# Patient Record
Sex: Female | Born: 1985 | Race: Black or African American | Hispanic: No | Marital: Single | Smoking: Never smoker
Health system: Southern US, Community
[De-identification: ages and names within clinical notes are randomized; demographics above are authoritative.]

## PROBLEM LIST (undated history)

## (undated) ENCOUNTER — Inpatient Hospital Stay (HOSPITAL_COMMUNITY): Payer: Self-pay

## (undated) DIAGNOSIS — L732 Hidradenitis suppurativa: Secondary | ICD-10-CM

## (undated) DIAGNOSIS — Z789 Other specified health status: Secondary | ICD-10-CM

## (undated) HISTORY — PX: DILATION AND CURETTAGE OF UTERUS: SHX78

## (undated) HISTORY — PX: THERAPEUTIC ABORTION: SHX798

---

## 2016-12-10 ENCOUNTER — Encounter: Payer: Self-pay | Admitting: Family Medicine

## 2016-12-18 ENCOUNTER — Ambulatory Visit (INDEPENDENT_AMBULATORY_CARE_PROVIDER_SITE_OTHER): Payer: No Typology Code available for payment source | Admitting: Physician Assistant

## 2016-12-18 ENCOUNTER — Encounter: Payer: Self-pay | Admitting: Physician Assistant

## 2016-12-18 VITALS — BP 115/79 | HR 90 | Temp 98.2°F | Resp 18 | Ht 60.55 in | Wt 156.4 lb

## 2016-12-18 DIAGNOSIS — N760 Acute vaginitis: Secondary | ICD-10-CM

## 2016-12-18 DIAGNOSIS — Z3201 Encounter for pregnancy test, result positive: Secondary | ICD-10-CM | POA: Diagnosis not present

## 2016-12-18 DIAGNOSIS — Z13220 Encounter for screening for lipoid disorders: Secondary | ICD-10-CM | POA: Diagnosis not present

## 2016-12-18 DIAGNOSIS — N898 Other specified noninflammatory disorders of vagina: Secondary | ICD-10-CM | POA: Diagnosis not present

## 2016-12-18 DIAGNOSIS — Z0000 Encounter for general adult medical examination without abnormal findings: Secondary | ICD-10-CM | POA: Diagnosis not present

## 2016-12-18 DIAGNOSIS — Z13228 Encounter for screening for other metabolic disorders: Secondary | ICD-10-CM

## 2016-12-18 DIAGNOSIS — Z130 Encounter for screening for diseases of the blood and blood-forming organs and certain disorders involving the immune mechanism: Secondary | ICD-10-CM

## 2016-12-18 DIAGNOSIS — Z1389 Encounter for screening for other disorder: Secondary | ICD-10-CM | POA: Diagnosis not present

## 2016-12-18 DIAGNOSIS — N926 Irregular menstruation, unspecified: Secondary | ICD-10-CM | POA: Diagnosis not present

## 2016-12-18 DIAGNOSIS — B9689 Other specified bacterial agents as the cause of diseases classified elsewhere: Secondary | ICD-10-CM | POA: Diagnosis not present

## 2016-12-18 DIAGNOSIS — Z1329 Encounter for screening for other suspected endocrine disorder: Secondary | ICD-10-CM | POA: Diagnosis not present

## 2016-12-18 LAB — POCT URINE PREGNANCY: PREG TEST UR: POSITIVE — AB

## 2016-12-18 LAB — POCT URINALYSIS DIP (MANUAL ENTRY)
BILIRUBIN UA: NEGATIVE mg/dL
Bilirubin, UA: NEGATIVE
Glucose, UA: NEGATIVE mg/dL
Nitrite, UA: NEGATIVE
RBC UA: NEGATIVE
SPEC GRAV UA: 1.025 (ref 1.010–1.025)
UROBILINOGEN UA: 1 U/dL
pH, UA: 7 (ref 5.0–8.0)

## 2016-12-18 LAB — POCT WET + KOH PREP
Trich by wet prep: ABSENT
YEAST BY KOH: ABSENT
Yeast by wet prep: ABSENT

## 2016-12-18 MED ORDER — CLINDAMYCIN PHOSPHATE 2 % VA CREA: 1.0000 | TOPICAL_CREAM | Freq: Every day | VAGINAL | 0 refills | Status: AC

## 2016-12-18 NOTE — Progress Notes (Signed)
Crystal Dominguez  MRN: [id number] DOB: June 28, 1985  Subjective:  Pt is a 31 y.o. female who presents for annual physical exam. Pt is fasting today.   Social: She is from [place]. Moved here about 7 months ago. 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 She is currently in a relationship. She is sexually active with monogamous partner. Diet: Eats everything. Eats [place] mostly. Does not get enough fruits and veggies. Minimal dairy. Drinks water mostly. Does not take a multivitamin.  Exercise: No structured exercise.  Sleep: Gets at least 8 hours.  Bowel Movements: Daily, regular for her.  Menstrual Cycle: Typically regular, cycles occur every 25 days and last 5 days. LMP 11/07/16, did take a morning after pill on 11/19/2016. She is not on any birth control.   Last annual exam: 2017 Last dental exam: 05.2017 Last vision exam: 2016 Last pap smear: 11/2015, just saw a gynecologist in 09/2016 for an establish care visit.   Vaccinations      Tetanus: She knows it was less than 10 years ago.        Acute issues:  Vaginal odor: Has been having a vaginal odor x 1 week. Last treated for BV in 09/2015. Has one sexual partner. Denies vaginal discharge and itching.   There are no active problems to display for this patient.   No current outpatient prescriptions on file prior to visit.   No current facility-administered medications on file prior to visit.     Allergies  Allergen Reactions  . Shellfish-Derived Products     Crabmeat    Social History   Social History  . Marital status: Single    Spouse name: N/A  . Number of children: N/A  . Years of education: N/A   Social History Main Topics  . Smoking status: Never Smoker  . Smokeless tobacco: Never Used  . Alcohol use 1.8 oz/week    3 Glasses of wine per week  . Drug use: No  . Sexual activity: Yes    Partners: Male    Birth control/ protection: None   Other Topics Concern  . None   Social  History Narrative   She is from [place]. 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 She is sexually active with monogamous partner.     History reviewed. No pertinent surgical history.  History reviewed. No pertinent family history.  Review of Systems  Constitutional: Negative for activity change, appetite change, chills, diaphoresis, fatigue, fever and unexpected weight change.  HENT: Negative for congestion, dental problem, drooling, ear discharge, ear pain, facial swelling, hearing loss, mouth sores, nosebleeds, postnasal drip, rhinorrhea, sinus pain, sinus pressure, sneezing, sore throat, tinnitus, trouble swallowing and voice change.   Eyes: Negative for photophobia, pain, discharge, redness, itching and visual disturbance.  Respiratory: Negative for apnea, cough, choking, chest tightness, shortness of breath, wheezing and stridor.   Cardiovascular: Negative for chest pain, palpitations and leg swelling.  Gastrointestinal: Negative for abdominal distention, abdominal pain, anal bleeding, blood in stool, constipation, diarrhea, nausea, rectal pain and vomiting.  Endocrine: Negative for cold intolerance, heat intolerance, polydipsia, polyphagia and polyuria.  Genitourinary: Negative for decreased urine volume, difficulty urinating, dyspareunia, dysuria, enuresis, flank pain, frequency, genital sores, hematuria, menstrual problem, pelvic pain, urgency, vaginal bleeding, vaginal discharge and vaginal pain.  Musculoskeletal: Negative for arthralgias, back pain, gait problem, joint swelling, myalgias, neck pain and neck stiffness.  Skin: Negative for color change, pallor, rash  and wound.  Allergic/Immunologic: Negative for environmental allergies, food allergies and immunocompromised state.  Neurological: Negative for dizziness, tremors, seizures, syncope, facial asymmetry, speech difficulty, weakness, light-headedness, numbness and headaches.    Hematological: Negative for adenopathy. Does not bruise/bleed easily.  Psychiatric/Behavioral: Negative for agitation, behavioral problems, confusion, decreased concentration, dysphoric mood, hallucinations, self-injury, sleep disturbance and suicidal ideas. The patient is not nervous/anxious and is not hyperactive.     Objective:  BP 115/79 (BP Location: Right Arm, Patient Position: Sitting, Cuff Size: Normal)   Pulse 90   Temp 98.2 F (36.8 C) (Oral)   Resp 18   Ht 5' 0.55" (1.538 m)   Wt 156 lb 6.4 oz (70.9 kg)   LMP 11/07/2016 (Exact Date)   SpO2 100%   BMI 29.99 kg/m   Physical Exam  Constitutional: She is oriented to person, place, and time and well-developed, well-nourished, and in no distress.  HENT:  Head: Normocephalic and atraumatic.  Right Ear: Hearing, tympanic membrane, external ear and ear canal normal.  Left Ear: Hearing, tympanic membrane, external ear and ear canal normal.  Nose: Nose normal.  Mouth/Throat: Uvula is midline, oropharynx is clear and moist and mucous membranes are normal. No oropharyngeal exudate.  Eyes: Pupils are equal, round, and reactive to light. Conjunctivae, EOM and lids are normal. No scleral icterus.  Neck: Trachea normal and normal range of motion. No thyroid mass and no thyromegaly present.  Cardiovascular: Normal rate, regular rhythm, normal heart sounds and intact distal pulses.   Pulmonary/Chest: Effort normal and breath sounds normal.  Abdominal: Soft. Normal appearance and bowel sounds are normal. There is no tenderness.  Navel piercing noted.   Genitourinary: Vulva normal. Thin  fishy  white and vaginal discharge found.  Lymphadenopathy:       Head (right side): No tonsillar, no preauricular, no posterior auricular and no occipital adenopathy present.       Head (left side): No tonsillar, no preauricular, no posterior auricular and no occipital adenopathy present.    She has no cervical adenopathy.       Right: No supraclavicular  adenopathy present.       Left: No supraclavicular adenopathy present.  Neurological: She is alert and oriented to person, place, and time. She has normal sensation, normal strength and normal reflexes. Gait normal.  Skin: Skin is warm and dry.  Psychiatric: Affect normal.   No exam data present   Results for orders placed or performed in visit on 12/18/16 (from the past 24 hour(s))  POCT urinalysis dipstick     Status: Abnormal   Collection Time: 12/18/16  9:09 AM  Result Value Ref Range   Color, UA yellow yellow   Clarity, UA clear clear   Glucose, UA negative negative mg/dL   Bilirubin, UA negative negative   Ketones, POC UA negative negative mg/dL   Spec Grav, UA 1.025 1.010 - 1.025   Blood, UA negative negative   pH, UA 7.0 5.0 - 8.0   Protein Ur, POC trace (A) negative mg/dL   Urobilinogen, UA 1.0 0.2 or 1.0 E.U./dL   Nitrite, UA Negative Negative   Leukocytes, UA Trace (A) Negative  POCT urine pregnancy     Status: Abnormal   Collection Time: 12/18/16  9:09 AM  Result Value Ref Range   Preg Test, Ur Positive (A) Negative  POCT Wet + KOH Prep     Status: Abnormal   Collection Time: 12/18/16  9:13 AM  Result Value Ref Range   Yeast by KOH  Absent Absent   Yeast by wet prep Absent Absent   WBC by wet prep None (A) Few   Clue Cells Wet Prep HPF POC Moderate (A) None   Trich by wet prep Absent Absent   Bacteria Wet Prep HPF POC Too numerous to count  (A) Few   Epithelial Cells By [employer] ([facility]) None None, Few, Too numerous to count   RBC,UR,HPF,POC None None RBC/hpf    Assessment and Plan :  Discussed healthy lifestyle, diet, exercise, preventative care, vaccinations, and addressed patient's concerns. Plan for follow up in one year. Otherwise, plan for specific conditions below.  1. Annual physical exam Await lab results   2. Screening, anemia, deficiency, iron - CBC with Differential/Platelet  3. Screening for metabolic disorder - QMG86+PYPP  4. Screening,  lipid - Lipid panel  5. Screening for thyroid disorder - TSH  6. Screening for hematuria or proteinuria - POCT urinalysis dipstick  7. Vaginal odor - POCT Wet + KOH Prep  8. Irregular menstrual cycle - POCT urine pregnancy  9. Positive pregnancy test Positive pregnancy test. Pt encouraged to contact gyn and schedule a follow up appointment. Avoid alcohol at this time.  - Beta HCG, Quant  10. Bacterial vaginosis Will tx with cleocin vaginal cream due to positive pregnancy test. Given educational material on BV.  - clindamycin (CLEOCIN) 2 % vaginal cream; Place 1 Applicatorful vaginally at bedtime.  Dispense: 40 g; Refill: 0   Tenna Delaine, PA-C  [facility] 12/18/2016 12:38 PM

## 2016-12-18 NOTE — Patient Instructions (Addendum)
Start taking a daily multivitamin. Increase your vegetable and fruit consumption. Start engaging in structured exercise at least twice a week.       Commonly Asked Questions During Pregnancy  Cats: A parasite can be excreted in cat feces.  To avoid exposure you need to have another person empty the little box.  If you must empty the litter box you will need to wear gloves.  Wash your hands after handling your cat.  This parasite can also be found in raw or undercooked meat so this should also be avoided.  Colds, Sore Throats, Flu: Please check your medication sheet to see what you can take for symptoms.  If your symptoms are unrelieved by these medications please call the office.  Dental Work: Most any dental work [employer] recommends is permitted.  X-rays should only be taken during the first trimester if absolutely necessary.  Your abdomen should be shielded with a lead apron during all x-rays.  Please notify your provider prior to receiving any x-rays.  Novocaine is fine; gas is not recommended.  If your dentist requires a note from [place] prior to dental work please call the office and we will provide one for you.  Exercise: Exercise is an important part of staying healthy during your pregnancy.  You may continue most exercises you were accustomed to prior to pregnancy.  Later in your pregnancy you will most likely notice you have difficulty with activities requiring balance like riding a bicycle.  It is important that you listen to your body and avoid activities that put you at a higher risk of falling.  Adequate rest and staying well hydrated are a must!  If you have questions about the safety of specific activities ask your provider.    Exposure to Children with illness: Try to avoid obvious exposure; report any symptoms to [place] when noted,  If you have chicken pos, red measles or mumps, you should be immune to these diseases.   Please do not take any vaccines while pregnant unless you have  checked with your OB provider.  Fetal Movement: After 28 weeks we recommend you do "kick counts" twice daily.  Lie or sit down in a calm quiet environment and count your baby movements "kicks".  You should feel your baby at least 10 times per hour.  If you have not felt 10 kicks within the first hour get up, walk around and have something sweet to eat or drink then repeat for an additional hour.  If count remains less than 10 per hour notify your provider.  Fumigating: Follow your pest control agent's advice as to how long to stay out of your home.  Ventilate the area well before re-entering.  Hemorrhoids:   Most over-the-counter preparations can be used during pregnancy.  Check your medication to see what is safe to use.  It is important to use a stool softener or fiber in your diet and to drink lots of liquids.  If hemorrhoids seem to be getting worse please call the office.   Hot Tubs:  Hot tubs Jacuzzis and saunas are not recommended while pregnant.  These increase your internal body temperature and should be avoided.  Intercourse:  Sexual intercourse is safe during pregnancy as long as you are comfortable, unless otherwise advised by your provider.  Spotting may occur after intercourse; report any bright red bleeding that is heavier than spotting.  Labor:  If you know that you are in labor, please go to the hospital.  If you are unsure, please call the office and let us help you decide what to do.  Lifting, straining, etc:  If your job requires heavy lifting or straining please check with your provider for any limitations.  Generally, you should not lift items heavier than that you can lift simply with your hands and arms (no back muscles)  Painting:  Paint fumes do not harm your pregnancy, but may make you ill and should be avoided if possible.  Latex or water based paints have less odor than oils.  Use adequate ventilation while painting.  Permanents & Hair Color:  Chemicals in hair dyes are  not recommended as they cause increase hair dryness which can increase hair loss during pregnancy.  " Highlighting" and permanents are allowed.  Dye may be absorbed differently and permanents may not hold as well during pregnancy.  Sunbathing:  Use a sunscreen, as skin burns easily during pregnancy.  Drink plenty of fluids; avoid over heating.  Tanning Beds:  Because their possible side effects are still unknown, tanning beds are not recommended.  Ultrasound Scans:  Routine ultrasounds are performed at approximately 20 weeks.  You will be able to see your baby's general anatomy an if you would like to know the gender this can usually be determined as well.  If it is questionable when you conceived you may also receive an ultrasound early in your pregnancy for dating purposes.  Otherwise ultrasound exams are not routinely performed unless there is a medical necessity.  Although you can request a scan we ask that you pay for it when conducted because insurance does not cover " patient request" scans.  Work: If your pregnancy proceeds without complications you may work until your due date, unless your physician or employer advises otherwise.  Round Ligament Pain/Pelvic Discomfort:  Sharp, shooting pains not associated with bleeding are fairly common, usually occurring in the second trimester of pregnancy.  They tend to be worse when standing up or when you remain standing for long periods of time.  These are the result of pressure of certain pelvic ligaments called "round ligaments".  Rest, Tylenol and heat seem to be the most effective relief.  As the womb and fetus grow, they rise out of the pelvis and the discomfort improves.  Please notify the office if your pain seems different than that described.  It may represent a more serious condition.   Bacterial Vaginosis Bacterial vaginosis is an infection of the vagina. It happens when too many germs (bacteria) grow in the vagina. This infection puts you at  risk for infections from sex (STIs). Treating this infection can lower your risk for some STIs. You should also treat this if you are pregnant. It can cause your baby to be born early. Follow these instructions at home: Medicines  Take over-the-counter and prescription medicines only as told by your doctor.  Take or use your antibiotic medicine as told by your doctor. Do not stop taking or using it even if you start to feel better. General instructions  If you your sexual partner is a woman, tell her that you have this infection. She needs to get treatment if she has symptoms. If you have a female partner, he does not need to be treated.  During treatment: ? Avoid sex. ? Do not douche. ? Avoid alcohol as told. ? Avoid breastfeeding as told.  Drink enough fluid to keep your pee (urine) clear or pale yellow.  Keep your vagina and butt (rectum) clean. ?  Wash the area with warm water every day. ? Wipe from front to back after you use the toilet.  Keep all follow-up visits as told by your doctor. This is important. Preventing this condition  Do not douche.  Use only warm water to wash around your vagina.  Use protection when you have sex. This includes: ? Latex condoms. ? Dental dams.  Limit how many people you have sex with. It is best to only have sex with the same person (be monogamous).  Get tested for STIs. Have your partner get tested.  Wear underwear that is cotton or lined with cotton.  Avoid tight pants and pantyhose. This is most important in summer.  Do not use any products that have nicotine or tobacco in them. These include cigarettes and e-cigarettes. If you need help quitting, ask your doctor.  Do not use illegal drugs.  Limit how much alcohol you drink. Contact a doctor if:  Your symptoms do not get better, even after you are treated.  You have more discharge or pain when you pee (urinate).  You have a fever.  You have pain in your belly  (abdomen).  You have pain with sex.  Your bleed from your vagina between periods. Summary  This infection happens when too many germs (bacteria) grow in the vagina.  Treating this condition can lower your risk for some infections from sex (STIs).  You should also treat this if you are pregnant. It can cause early (premature) birth.  Do not stop taking or using your antibiotic medicine even if you start to feel better. This information is not intended to replace advice given to you by your health care provider. Make sure you discuss any questions you have with your health care provider. Document Released: 02/20/2008 Document Revised: 01/27/2016 Document Reviewed: 01/27/2016 Elsevier Interactive Patient Education  2017 [place] Maintenance, Female Adopting a healthy lifestyle and getting preventive care can go a long way to promote health and wellness. Talk with your health care provider about what schedule of regular examinations is right for you. This is a good chance for you to check in with your provider about disease prevention and staying healthy. In between checkups, there are plenty of things you can do on your own. Experts have done a lot of research about which lifestyle changes and preventive measures are most likely to keep you healthy. Ask your health care provider for more information. Weight and diet Eat a healthy diet  Be sure to include plenty of vegetables, fruits, low-fat dairy products, and lean protein.  Do not eat a lot of foods high in solid fats, added sugars, or salt.  Get regular exercise. This is one of the most important things you can do for your health. ? Most adults should exercise for at least 150 minutes each week. The exercise should increase your heart rate and make you sweat (moderate-intensity exercise). ? Most adults should also do strengthening exercises at least twice a week. This is in addition to the moderate-intensity  exercise.  Maintain a healthy weight  Body mass index (BMI) is a measurement that can be used to identify possible weight problems. It estimates body fat based on height and weight. Your health care provider can help determine your BMI and help you achieve or maintain a healthy weight.  For females 29 years of age and older: ? A BMI below 18.5 is considered underweight. ? A BMI of 18.5 to 24.9 is normal. ? A BMI  of 25 to 29.9 is considered overweight. ? A BMI of 30 and above is considered obese.  Watch levels of cholesterol and blood lipids  You should start having your blood tested for lipids and cholesterol at 29 years of age, then have this test every 5 years.  You may need to have your cholesterol levels checked more often if: ? Your lipid or cholesterol levels are high. ? You are older than 29 years of age. ? You are at high risk for heart disease.  Cancer screening Lung Cancer  Lung cancer screening is recommended for adults 26-20 years old who are at high risk for lung cancer because of a history of smoking.  A yearly low-dose CT scan of the lungs is recommended for people who: ? Currently smoke. ? Have quit within the past 15 years. ? Have at least a 30-pack-year history of smoking. A pack year is smoking an average of one pack of cigarettes a day for 1 year.  Yearly screening should continue until it has been 15 years since you quit.  Yearly screening should stop if you develop a health problem that would prevent you from having lung cancer treatment.  Breast Cancer  Practice breast self-awareness. This means understanding how your breasts normally appear and feel.  It also means doing regular breast self-exams. Let your health care provider know about any changes, no matter how small.  If you are in your 20s or 30s, you should have a clinical breast exam (CBE) by a health care provider every 1-3 years as part of a regular health exam.  If you are 33 or older, have  a CBE every year. Also consider having a breast X-ray (mammogram) every year.  If you have a family history of breast cancer, talk to your health care provider about genetic screening.  If you are at high risk for breast cancer, talk to your health care provider about having an MRI and a mammogram every year.  Breast cancer gene (BRCA) assessment is recommended for women who have family members with BRCA-related cancers. BRCA-related cancers include: ? Breast. ? Ovarian. ? Tubal. ? Peritoneal cancers.  Results of the assessment will determine the need for genetic counseling and BRCA1 and BRCA2 testing.  Cervical Cancer Your health care provider may recommend that you be screened regularly for cancer of the pelvic organs (ovaries, uterus, and vagina). This screening involves a pelvic examination, including checking for microscopic changes to the surface of your cervix (Pap test). You may be encouraged to have this screening done every 3 years, beginning at age 41.  For women ages 45-65, health care providers may recommend pelvic exams and Pap testing every 3 years, or they may recommend the Pap and pelvic exam, combined with testing for human papilloma virus (HPV), every 5 years. Some types of HPV increase your risk of cervical cancer. Testing for HPV may also be done on women of any age with unclear Pap test results.  Other health care providers may not recommend any screening for nonpregnant women who are considered low risk for pelvic cancer and who do not have symptoms. Ask your health care provider if a screening pelvic exam is right for you.  If you have had past treatment for cervical cancer or a condition that could lead to cancer, you need Pap tests and screening for cancer for at least 20 years after your treatment. If Pap tests have been discontinued, your risk factors (such as having a new sexual partner)  need to be reassessed to determine if screening should resume. Some women have  medical problems that increase the chance of getting cervical cancer. In these cases, your health care provider may recommend more frequent screening and Pap tests.  Colorectal Cancer  This type of cancer can be detected and often prevented.  Routine colorectal cancer screening usually begins at 29 years of age and continues through 29 years of age.  Your health care provider may recommend screening at an earlier age if you have risk factors for colon cancer.  Your health care provider may also recommend using home test kits to check for hidden blood in the stool.  A small camera at the end of a tube can be used to examine your colon directly (sigmoidoscopy or colonoscopy). This is done to check for the earliest forms of colorectal cancer.  Routine screening usually begins at age 59.  Direct examination of the colon should be repeated every 5-10 years through 29 years of age. However, you may need to be screened more often if early forms of precancerous polyps or small growths are found.  Skin Cancer  Check your skin from head to toe regularly.  Tell your health care provider about any new moles or changes in moles, especially if there is a change in a mole's shape or color.  Also tell your health care provider if you have a mole that is larger than the size of a pencil eraser.  Always use sunscreen. Apply sunscreen liberally and repeatedly throughout the day.  Protect yourself by wearing long sleeves, pants, a wide-brimmed hat, and sunglasses whenever you are outside.  Heart disease, diabetes, and high blood pressure  High blood pressure causes heart disease and increases the risk of stroke. High blood pressure is more likely to develop in: ? People who have blood pressure in the high end of the normal range (130-139/85-89 mm Hg). ? People who are overweight or obese. ? People who are African American.  If you are 58-54 years of age, have your blood pressure checked every 3-5  years. If you are 80 years of age or older, have your blood pressure checked every year. You should have your blood pressure measured twice-once when you are at a hospital or clinic, and once when you are not at a hospital or clinic. Record the average of the two measurements. To check your blood pressure when you are not at a hospital or clinic, you can use: ? An automated blood pressure machine at a pharmacy. ? A home blood pressure monitor.  If you are between 74 years and 41 years old, ask your health care provider if you should take aspirin to prevent strokes.  Have regular diabetes screenings. This involves taking a blood sample to check your fasting blood sugar level. ? If you are at a normal weight and have a low risk for diabetes, have this test once every three years after 29 years of age. ? If you are overweight and have a high risk for diabetes, consider being tested at a younger age or more often. Preventing infection Hepatitis B  If you have a higher risk for hepatitis B, you should be screened for this virus. You are considered at high risk for hepatitis B if: ? You were born in a country where hepatitis B is common. Ask your health care provider which countries are considered high risk. ? Your parents were born in a high-risk country, and you have not been immunized against hepatitis  B (hepatitis B vaccine). ? You have HIV or AIDS. ? You use needles to inject street drugs. ? You live with someone who has hepatitis B. ? You have had sex with someone who has hepatitis B. ? You get hemodialysis treatment. ? You take certain medicines for conditions, including cancer, organ transplantation, and autoimmune conditions.  Hepatitis C  Blood testing is recommended for: ? Everyone born from [birth year] through 1963. ? Anyone with known risk factors for hepatitis C.  Sexually transmitted infections (STIs)  You should be screened for sexually transmitted infections (STIs) including  gonorrhea and chlamydia if: ? You are sexually active and are younger than 29 years of age. ? You are older than 29 years of age and your health care provider tells you that you are at risk for this type of infection. ? Your sexual activity has changed since you were last screened and you are at an increased risk for chlamydia or gonorrhea. Ask your health care provider if you are at risk.  If you do not have HIV, but are at risk, it may be recommended that you take a prescription medicine daily to prevent HIV infection. This is called pre-exposure prophylaxis (PrEP). You are considered at risk if: ? You are sexually active and do not regularly use condoms or know the HIV status of your partner(s). ? You take drugs by injection. ? You are sexually active with a partner who has HIV.  Talk with your health care provider about whether you are at high risk of being infected with HIV. If you choose to begin PrEP, you should first be tested for HIV. You should then be tested every 3 months for as long as you are taking PrEP. Pregnancy  If you are premenopausal and you may become pregnant, ask your health care provider about preconception counseling.  If you may become pregnant, take 400 to 800 micrograms (mcg) of folic acid every day.  If you want to prevent pregnancy, talk to your health care provider about birth control (contraception). Osteoporosis and menopause  Osteoporosis is a disease in which the bones lose minerals and strength with aging. This can result in serious bone fractures. Your risk for osteoporosis can be identified using a bone density scan.  If you are 4 years of age or older, or if you are at risk for osteoporosis and fractures, ask your health care provider if you should be screened.  Ask your health care provider whether you should take a calcium or vitamin D supplement to lower your risk for osteoporosis.  Menopause may have certain physical symptoms and  risks.  Hormone replacement therapy may reduce some of these symptoms and risks. Talk to your health care provider about whether hormone replacement therapy is right for you. Follow these instructions at home:  Schedule regular health, dental, and eye exams.  Stay current with your immunizations.  Do not use any tobacco products including cigarettes, chewing tobacco, or electronic cigarettes.  If you are pregnant, do not drink alcohol.  If you are breastfeeding, limit how much and how often you drink alcohol.  Limit alcohol intake to no more than 1 drink per day for nonpregnant women. One drink equals 12 ounces of beer, 5 ounces of wine, or 1 ounces of hard liquor.  Do not use street drugs.  Do not share needles.  Ask your health care provider for help if you need support or information about quitting drugs.  Tell your health care provider if you often  feel depressed.  Tell your health care provider if you have ever been abused or do not feel safe at home. This information is not intended to replace advice given to you by your health care provider. Make sure you discuss any questions you have with your health care provider. Document Released: 11/26/2010 Document Revised: 10/19/2015 Document Reviewed: 02/14/2015 Elsevier Interactive Patient Education  2018 [employer].    IF you received an x-ray today, you will receive an invoice from [facility]. Please contact [facility] at [phone] with questions or concerns regarding your invoice.   IF you received labwork today, you will receive an invoice from [facility]. Please contact [facility] at [phone] with questions or concerns regarding your invoice.   Our billing staff will not be able to assist you with questions regarding bills from these companies.  You will be contacted with the lab results as soon as they are available. The fastest way to get your results is to activate your My Chart account.  Instructions are located on the last page of this paperwork. If you have not heard from [place] regarding the results in 2 weeks, please contact this office.

## 2016-12-19 LAB — CMP14+EGFR
ALBUMIN: 4.3 g/dL (ref 3.5–5.5)
ALK PHOS: 49 IU/L (ref 39–117)
ALT: 13 IU/L (ref 0–32)
AST: 19 IU/L (ref 0–40)
Albumin/Globulin Ratio: 1.8 (ref 1.2–2.2)
BILIRUBIN TOTAL: 0.3 mg/dL (ref 0.0–1.2)
BUN / CREAT RATIO: 15 (ref 9–23)
BUN: 9 mg/dL (ref 6–20)
CO2: 24 mmol/L (ref 20–29)
CREATININE: 0.61 mg/dL (ref 0.57–1.00)
Calcium: 9.3 mg/dL (ref 8.7–10.2)
Chloride: 101 mmol/L (ref 96–106)
GFR calc non Af Amer: 123 mL/min/{1.73_m2} (ref >59)
GFR, EST AFRICAN AMERICAN: 142 mL/min/{1.73_m2} (ref >59)
GLOBULIN, TOTAL: 2.4 g/dL (ref 1.5–4.5)
Glucose: 92 mg/dL (ref 65–99)
Potassium: 4.3 mmol/L (ref 3.5–5.2)
SODIUM: 138 mmol/L (ref 134–144)
TOTAL PROTEIN: 6.7 g/dL (ref 6.0–8.5)

## 2016-12-19 LAB — CBC WITH DIFFERENTIAL/PLATELET
BASOS: 0 %
Basophils Absolute: 0 10*3/uL (ref 0.0–0.2)
EOS (ABSOLUTE): 0.1 10*3/uL (ref 0.0–0.4)
EOS: 1 %
HEMATOCRIT: 39.6 % (ref 34.0–46.6)
HEMOGLOBIN: 12.9 g/dL (ref 11.1–15.9)
Immature Grans (Abs): 0 10*3/uL (ref 0.0–0.1)
Immature Granulocytes: 0 %
LYMPHS ABS: 2.7 10*3/uL (ref 0.7–3.1)
Lymphs: 35 %
MCH: 29.1 pg (ref 26.6–33.0)
MCHC: 32.6 g/dL (ref 31.5–35.7)
MCV: 89 fL (ref 79–97)
MONOCYTES: 6 %
MONOS ABS: 0.5 10*3/uL (ref 0.1–0.9)
NEUTROS ABS: 4.5 10*3/uL (ref 1.4–7.0)
Neutrophils: 58 %
Platelets: 261 10*3/uL (ref 150–379)
RBC: 4.43 x10E6/uL (ref 3.77–5.28)
RDW: 14.2 % (ref 12.3–15.4)
WBC: 7.8 10*3/uL (ref 3.4–10.8)

## 2016-12-19 LAB — LIPID PANEL
CHOL/HDL RATIO: 2.4 ratio (ref 0.0–4.4)
Cholesterol, Total: 162 mg/dL (ref 100–199)
HDL: 68 mg/dL (ref >39)
LDL CALC: 83 mg/dL (ref 0–99)
TRIGLYCERIDES: 57 mg/dL (ref 0–149)
VLDL Cholesterol Cal: 11 mg/dL (ref 5–40)

## 2016-12-19 LAB — TSH: TSH: 1.31 u[IU]/mL (ref 0.450–4.500)

## 2016-12-20 LAB — CBC WITH DIFFERENTIAL/PLATELET

## 2016-12-20 LAB — LIPID PANEL

## 2016-12-20 LAB — CMP14+EGFR

## 2016-12-20 LAB — TSH

## 2016-12-23 ENCOUNTER — Telehealth: Payer: Self-pay | Admitting: Physician Assistant

## 2016-12-23 NOTE — Telephone Encounter (Signed)
Pt is looking for lab results and would also like to know her blood type   Best number [phone]

## 2016-12-23 NOTE — Telephone Encounter (Deleted)
Pt states that he got his blood results but now needs his urine results  Best number [phone]

## 2016-12-24 NOTE — Telephone Encounter (Signed)
Lab results has not been reviewed by provider

## 2016-12-25 LAB — BETA HCG QUANT (REF LAB): hCG Quant: 11872 m[IU]/mL

## 2016-12-26 NOTE — Telephone Encounter (Signed)
I have contacted pt and discussed lab results with her. Please see lab result encounter for further details.

## 2017-01-18 ENCOUNTER — Ambulatory Visit (INDEPENDENT_AMBULATORY_CARE_PROVIDER_SITE_OTHER): Payer: No Typology Code available for payment source | Admitting: Physician Assistant

## 2017-01-18 ENCOUNTER — Encounter: Payer: Self-pay | Admitting: Physician Assistant

## 2017-01-18 VITALS — BP 122/85 | HR 82 | Temp 98.5°F | Resp 16 | Ht 60.0 in | Wt 155.0 lb

## 2017-01-18 DIAGNOSIS — Z23 Encounter for immunization: Secondary | ICD-10-CM

## 2017-01-18 DIAGNOSIS — N898 Other specified noninflammatory disorders of vagina: Secondary | ICD-10-CM | POA: Diagnosis not present

## 2017-01-18 LAB — POCT WET + KOH PREP
TRICH BY WET PREP: ABSENT
Yeast by KOH: ABSENT
Yeast by wet prep: ABSENT

## 2017-01-18 LAB — POCT URINALYSIS DIP (MANUAL ENTRY)
BILIRUBIN UA: NEGATIVE mg/dL
Bilirubin, UA: NEGATIVE
Blood, UA: NEGATIVE
Glucose, UA: NEGATIVE mg/dL
NITRITE UA: NEGATIVE
Spec Grav, UA: >=1.03 — AB (ref 1.010–1.025)
Urobilinogen, UA: 1 E.U./dL
pH, UA: 6.5 (ref 5.0–8.0)

## 2017-01-18 LAB — POC MICROSCOPIC URINALYSIS (UMFC)

## 2017-01-18 MED ORDER — ZINC OXIDE 13 % EX CREA: 1.0000 "application " | TOPICAL_CREAM | Freq: Two times a day (BID) | CUTANEOUS | 0 refills | Status: DC

## 2017-01-18 NOTE — Patient Instructions (Addendum)
     IF you received an x-ray today, you will receive an invoice from [facility]. Please contact [facility] at [phone] with questions or concerns regarding your invoice.   IF you received labwork today, you will receive an invoice from [facility]. Please contact [facility] at [phone] with questions or concerns regarding your invoice.   Our billing staff will not be able to assist you with questions regarding bills from these companies.  You will be contacted with the lab results as soon as they are available. The fastest way to get your results is to activate your My Chart account. Instructions are located on the last page of this paperwork. If you have not heard from us regarding the results in 2 weeks, please contact this office.     

## 2017-01-18 NOTE — Progress Notes (Signed)
PRIMARY CARE AT [facility] [address] [place] [address] [phone]  Date:  01/18/2017   Name:  Crystal Dominguez   DOB:  1986/02/29   MRN:  [id number]  PCP:  Patient, No Pcp Per    History of Present Illness:  Crystal Dominguez is a 31 y.o. female patient who presents to PCP with  Chief Complaint  Patient presents with  . Vaginal Itching    discharge     Patient complains of vaginal discharge about 1 week ago.  Ob gave her diflucan x2 over 72 hours.  Discharge is clear.  recent vacationing with swimming and hours in wet suit. She is concerned that she has abnormal vaginal discharge.   She had heavy discharge.  She reports that this has improved some following the medications, continues to have some itching. Recent miscarriage within the last month.  This was followed by her gynecologist. She reports no dysuria, hematuria, or frequency. Same partner and sexually active.  Reports that he has large genitalia.  This can be painful at times.  She uses no lubricants with sexual activit Patient's last menstrual period was 11/07/2016.   There are no active problems to display for this patient.   No past medical history on file.  No past surgical history on file.  Social History  Substance Use Topics  . Smoking status: Never Smoker  . Smokeless tobacco: Never Used  . Alcohol use 1.8 oz/week    3 Glasses of wine per week    No family history on file.  Allergies  Allergen Reactions  . Shellfish-Derived Products     Crabmeat    Medication list has been reviewed and updated.  No current outpatient prescriptions on file prior to visit.   No current facility-administered medications on file prior to visit.     ROS ROS otherwise unremarkable unless listed above.  Physical Examination: BP 122/85   Pulse 82   Temp 98.5 F (36.9 C) (Oral)   Resp 16   Ht 5' (1.524 m)   Wt 155 lb (70.3 kg)   LMP 11/07/2016 Comment: miscarraige   SpO2 98%   BMI 30.27 kg/m  Ideal Body  Weight: Weight in (lb) to have BMI = 25: 127.7  Physical Exam  Constitutional: She is oriented to person, place, and time. She appears well-developed and well-nourished. No distress.  HENT:  Head: Normocephalic and atraumatic.  Right Ear: External ear normal.  Left Ear: External ear normal.  Eyes: Pupils are equal, round, and reactive to light. Conjunctivae and EOM are normal.  Cardiovascular: Normal rate.   Pulmonary/Chest: Effort normal. No respiratory distress.  Genitourinary:  Genitourinary Comments: No heavy discharge. Inferior to introitus, is dry mark of along the inner buttock.  Skin is slightly edematous.  Neurological: She is alert and oriented to person, place, and time.  Skin: She is not diaphoretic.  Psychiatric: She has a normal mood and affect. Her behavior is normal.   Results for orders placed or performed in visit on 01/18/17  POCT Wet + KOH Prep  Result Value Ref Range   Yeast by KOH Absent Absent   Yeast by wet prep Absent Absent   WBC by wet prep Few Few   Clue Cells Wet Prep HPF POC None None   Trich by wet prep Absent Absent   Bacteria Wet Prep HPF POC None (A) Few   Epithelial Cells By [employer] Pref ([facility]) Moderate (A) None, Few, Too numerous to count   RBC,UR,HPF,POC None None RBC/hpf  POCT Microscopic Urinalysis ([facility])  Result Value Ref Range   WBC,UR,HPF,POC Moderate (A) None WBC/hpf   RBC,UR,HPF,POC None None RBC/hpf   Bacteria Few (A) None, Too numerous to count   Mucus Present (A) Absent   Epithelial Cells, UR Per Microscopy Moderate (A) None, Too numerous to count cells/hpf  POCT urinalysis dipstick  Result Value Ref Range   Color, UA yellow yellow   Clarity, UA clear clear   Glucose, UA negative negative mg/dL   Bilirubin, UA negative negative   Ketones, POC UA negative negative mg/dL   Spec Grav, UA >=9.892 (A) 1.010 - 1.025   Blood, UA negative negative   pH, UA 6.5 5.0 - 8.0   Protein Ur, POC =30 (A) negative mg/dL   Urobilinogen, UA 1.0  0.2 or 1.0 E.U./dL   Nitrite, UA Negative Negative   Leukocytes, UA Trace (A) Negative     Assessment and Plan: Crystal Dominguez is a 31 y.o. female who is here today for cc of vaginal discharge.  Vaginal discharge - Plan: POCT Wet + KOH Prep, POCT Microscopic Urinalysis ([facility]), POCT urinalysis dipstick, Zinc Oxide 13 % CREA  Need for influenza vaccination  Crystal Platt, PA-C [facility] 8/26/20189:16 PM

## 2017-01-18 NOTE — Progress Notes (Signed)
  No chief complaint on file.   HPI  No past medical history on file.  No current outpatient prescriptions on file.   No current facility-administered medications for this visit.     Allergies:  Allergies  Allergen Reactions  . Shellfish-Derived Products     Crabmeat    No past surgical history on file.  Social History   Social History  . Marital status: Single    Spouse name: N/A  . Number of children: N/A  . Years of education: N/A   Social History Main Topics  . Smoking status: Never Smoker  . Smokeless tobacco: Never Used  . Alcohol use 1.8 oz/week    3 Glasses of wine per week  . Drug use: No  . Sexual activity: Yes    Partners: Male    Birth control/ protection: None   Other Topics Concern  . Not on file   Social History Narrative   She is from [place]. 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 She is currently in a relationship. She is sexually active with monogamous partner.     ROS  Objective: There were no vitals filed for this visit.  Physical Exam  Assessment and Plan There are no diagnoses linked to this encounter.   Crystal Dominguez [employer]

## 2017-02-03 ENCOUNTER — Telehealth: Payer: Self-pay

## 2017-02-03 ENCOUNTER — Telehealth: Payer: Self-pay | Admitting: Physician Assistant

## 2017-02-03 NOTE — Telephone Encounter (Signed)
Pt is needing to get something called in for a yeast infection   Best number [phone]

## 2017-02-03 NOTE — Telephone Encounter (Signed)
Please advise 

## 2017-02-05 MED ORDER — FLUCONAZOLE 150 MG PO TABS: 150.0000 mg | ORAL_TABLET | Freq: Once | ORAL | 0 refills | Status: AC

## 2017-02-05 NOTE — Telephone Encounter (Signed)
I filled this diflucan.  She may need to come in.  This is your patient, so I'm just sending to you in case you have questions you want to ask her.

## 2017-02-06 NOTE — Telephone Encounter (Signed)
Thanks Stephanie

## 2017-03-17 ENCOUNTER — Encounter: Payer: Self-pay | Admitting: Physician Assistant

## 2017-03-17 ENCOUNTER — Ambulatory Visit (INDEPENDENT_AMBULATORY_CARE_PROVIDER_SITE_OTHER): Payer: No Typology Code available for payment source | Admitting: Physician Assistant

## 2017-03-17 VITALS — BP 118/82 | HR 88 | Temp 98.8°F | Resp 18 | Ht 60.63 in | Wt 158.4 lb

## 2017-03-17 DIAGNOSIS — B9689 Other specified bacterial agents as the cause of diseases classified elsewhere: Secondary | ICD-10-CM

## 2017-03-17 DIAGNOSIS — N760 Acute vaginitis: Secondary | ICD-10-CM | POA: Diagnosis not present

## 2017-03-17 DIAGNOSIS — N898 Other specified noninflammatory disorders of vagina: Secondary | ICD-10-CM

## 2017-03-17 LAB — POCT WET + KOH PREP
Trich by wet prep: ABSENT
Yeast by KOH: ABSENT
Yeast by wet prep: ABSENT

## 2017-03-17 LAB — POCT URINALYSIS DIP (MANUAL ENTRY)
BILIRUBIN UA: NEGATIVE mg/dL
Bilirubin, UA: NEGATIVE
GLUCOSE UA: NEGATIVE mg/dL
Nitrite, UA: NEGATIVE
Protein Ur, POC: NEGATIVE mg/dL
RBC UA: NEGATIVE
SPEC GRAV UA: 1.02 (ref 1.010–1.025)
Urobilinogen, UA: 0.2 E.U./dL
pH, UA: 7 (ref 5.0–8.0)

## 2017-03-17 MED ORDER — METRONIDAZOLE 500 MG PO TABS: 500.0000 mg | ORAL_TABLET | Freq: Two times a day (BID) | ORAL | 0 refills | Status: DC

## 2017-03-17 MED ORDER — BORIC ACID POWD: 600.0000 mg | Freq: Every day | 0 refills | Status: AC

## 2017-03-17 NOTE — Progress Notes (Signed)
03/18/2017 at 10:47 AM  Crystal GelinasIkea Dominguez / DOB: 04/30/1986 / MRN: [id number]  The patient  does not have a problem list on file.  SUBJECTIVE  Crystal Dominguez is a 31 y.o. female who complains of vaginal discharge and fishy vaginal odor x 1 week. She denies dysuria, hematuria, urinary frequency, urinary urgency, flank pain, abdominal pain, pelvic pain and genital rash. Has tried avoiding tight fitting clothing, wearing cotton underwear, washing after sexual intercourse, probiotics, and combined OCP with no relief. 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 Prior to being seen in our clinic, she was closely followed by gynecology. She has never tried long term tx for recurrent BV but is interested as this is very frustrating for pt and she feels embarrassed due to the odor BV produces. Last menstrual cycles was 02/25/2017.  She  has no past medical history on file.    Medications reviewed and updated by myself where necessary, and exist elsewhere in the encounter.   Crystal Dominguez is allergic to shellfish-derived products. She  reports that she has never smoked. She has never used smokeless tobacco. She reports that she drinks about 1.8 oz of alcohol per week . She reports that she does not use drugs. She  reports that she currently engages in sexual activity and has had female partners. She reports using the following method of birth control/protection: None. The patient  has no past surgical history on file.  Her family history is not on file.  Review of Systems  Constitutional: Negative for chills, diaphoresis and fever.  Gastrointestinal: Negative for nausea and vomiting.    OBJECTIVE  Her  height is 5' 0.63" (1.54 m) and weight is 158 lb 6.4 oz (71.8 kg). Her oral temperature is 98.8 F (37.1 C). Her blood pressure is 118/82 and her pulse is 88. Her respiration is 18 and oxygen saturation is 99%.  The patient's body mass index is 30.3 kg/m.  Physical Exam    Constitutional: She is oriented to person, place, and time. She appears well-developed and well-nourished.  HENT:  Head: Normocephalic and atraumatic.  Eyes: Conjunctivae are normal.  Neck: Normal range of motion.  Pulmonary/Chest: Effort normal.  Abdominal: Normal appearance. There is no tenderness. There is no CVA tenderness.  Genitourinary:  Genitourinary Comments: Deferred, pt prefers to do self collect of KOH Wet prep.   Neurological: She is alert and oriented to person, place, and time.  Skin: Skin is warm and dry.  Psychiatric: She has a normal mood and affect.  Vitals reviewed.   Results for orders placed or performed in visit on 03/17/17 (from the past 24 hour(s))  POCT Wet + KOH Prep     Status: Abnormal   Collection Time: 03/17/17  5:41 PM  Result Value Ref Range   Yeast by KOH Absent Absent   Yeast by wet prep Absent Absent   WBC by wet prep None (A) Few   Clue Cells Wet Prep HPF POC Few (A) None   Trich by wet prep Absent Absent   Bacteria Wet Prep HPF POC Many (A) Few   Epithelial Cells By [employer] Pref ([facility]) Few None, Few, Too numerous to count   RBC,UR,HPF,POC None None RBC/hpf  POCT urinalysis dipstick     Status: Abnormal   Collection Time: 03/17/17  6:38 PM  Result Value Ref Range   Color, UA yellow yellow   Clarity, UA cloudy (A) clear   Glucose, UA negative negative mg/dL  Bilirubin, UA negative negative   Ketones, POC UA negative negative mg/dL   Spec Grav, UA 2.956 2.130 - 1.025   Blood, UA negative negative   pH, UA 7.0 5.0 - 8.0   Protein Ur, POC negative negative mg/dL   Urobilinogen, UA 0.2 0.2 or 1.0 E.U./dL   Nitrite, UA Negative Negative   Leukocytes, UA Small (1+) (A) Negative    ASSESSMENT & PLAN  Crystal Dominguez was seen today for vaginal discharge and vaginal odor.  Diagnoses and all orders for this visit:  Vaginal discharge -     Cancel: POCT Microscopic Urinalysis ([facility]) -     POCT Wet + KOH Prep -     POCT urinalysis dipstick -     Urine  Culture  Vaginal odor  Bacterial vaginosis -     Boric Acid POWD; Place 600 mg vaginally at bedtime. Insert in the vagina as directed daily x 21 days. After 21 days,use 1-3 times per week as tolerated. -     metroNIDAZOLE (FLAGYL) 500 MG tablet; Take 1 tablet (500 mg total) by mouth 2 (two) times daily with a meal. DO NOT CONSUME ALCOHOL WHILE TAKING THIS MEDICATION.   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 Pt encouraged to take flagyl as prescribed for the next 7 days and also start vaginal boric acid inserts nightly for the next 21 days. Return to office in 22 days for repeat KOH Wet prep. If in remission at that time,will begin metronidazole gel twice weekly for four to six months as suppressive therapy. Pt understands and agrees to plan.  The patient was advised to call or come back to clinic if she does not see an improvement in symptoms, or worsens with the above plan.   Benjiman Core, PA-C  Primary Care at [facility] 03/18/2017 10:51 AM

## 2017-03-17 NOTE — Patient Instructions (Addendum)
For recurrent BV, I would like you to take flagyl as prescribed for one week. Start vaginal boric acid today at bedtime. Insert one capsule daily x 21 days. After 21 days, return to clinic and we will repeat BV test to ensure it has cleared. After this you can reduce boric acid to 1-3 times a week as tolerated. In the meantime if you develop any fishy odor or vaginal discharge message me through mychart. Thank you for letting me participate in your health and well being.  Bacterial Vaginosis Bacterial vaginosis is a vaginal infection that occurs when the normal balance of bacteria in the vagina is disrupted. It results from an overgrowth of certain bacteria. This is the most common vaginal infection among women ages 104-44. Because bacterial vaginosis increases your risk for STIs (sexually transmitted infections), getting treated can help reduce your risk for chlamydia, gonorrhea, herpes, and HIV (human immunodeficiency virus). Treatment is also important for preventing complications in pregnant women, because this condition can cause an early (premature) delivery. What are the causes? This condition is caused by an increase in harmful bacteria that are normally present in small amounts in the vagina. However, the reason that the condition develops is not fully understood. What increases the risk? The following factors may make you more likely to develop this condition:  Having a new sexual partner or multiple sexual partners.  Having unprotected sex.  Douching.  Having an intrauterine device (IUD).  Smoking.  Drug and alcohol abuse.  Taking certain antibiotic medicines.  Being pregnant.  You cannot get bacterial vaginosis from toilet seats, bedding, swimming pools, or contact with objects around you. What are the signs or symptoms? Symptoms of this condition include:  Grey or white vaginal discharge. The discharge can also be watery or foamy.  A fish-like odor with discharge,  especially after sexual intercourse or during menstruation.  Itching in and around the vagina.  Burning or pain with urination.  Some women with bacterial vaginosis have no signs or symptoms. How is this diagnosed? This condition is diagnosed based on:  Your medical history.  A physical exam of the vagina.  Testing a sample of vaginal fluid under a microscope to look for a large amount of bad bacteria or abnormal cells. Your health care provider may use a cotton swab or a small wooden spatula to collect the sample.  How is this treated? This condition is treated with antibiotics. These may be given as a pill, a vaginal cream, or a medicine that is put into the vagina (suppository). If the condition comes back after treatment, a second round of antibiotics may be needed. Follow these instructions at home: Medicines  Take over-the-counter and prescription medicines only as told by your health care provider.  Take or use your antibiotic as told by your health care provider. Do not stop taking or using the antibiotic even if you start to feel better. General instructions  If you have a female sexual partner, tell her that you have a vaginal infection. She should see her health care provider and be treated if she has symptoms. If you have a female sexual partner, he does not need treatment.  During treatment: ? Avoid sexual activity until you finish treatment. ? Do not douche. ? Avoid alcohol as directed by your health care provider. ? Avoid breastfeeding as directed by your health care provider.  Drink enough water and fluids to keep your urine clear or pale yellow.  Keep the area around your vagina and  rectum clean. ? Wash the area daily with warm water. ? Wipe yourself from front to back after using the toilet.  Keep all follow-up visits as told by your health care provider. This is important. How is this prevented?  Do not douche.  Wash the outside of your vagina with warm  water only.  Use protection when having sex. This includes latex condoms and dental dams.  Limit how many sexual partners you have. To help prevent bacterial vaginosis, it is best to have sex with just one partner (monogamous).  Make sure you and your sexual partner are tested for STIs.  Wear cotton or cotton-lined underwear.  Avoid wearing tight pants and pantyhose, especially during summer.  Limit the amount of alcohol that you drink.  Do not use any products that contain nicotine or tobacco, such as cigarettes and e-cigarettes. If you need help quitting, ask your health care provider.  Do not use illegal drugs. Where to find more information:  Centers for Disease Control and Prevention: SolutionApps.co.zawww.cdc.gov/std  American Sexual Health Association (ASHA): www.ashastd.org  U.S. Department of Health and Health and safety inspectorHuman Services, Office on Women's Health: ConventionalMedicines.siwww.womenshealth.gov/ or http://www.anderson-williamson.info/https://www.womenshealth.gov/a-z-topics/bacterial-vaginosis Contact a health care provider if:  Your symptoms do not improve, even after treatment.  You have more discharge or pain when urinating.  You have a fever.  You have pain in your abdomen.  You have pain during sex.  You have vaginal bleeding between periods. Summary  Bacterial vaginosis is a vaginal infection that occurs when the normal balance of bacteria in the vagina is disrupted.  Because bacterial vaginosis increases your risk for STIs (sexually transmitted infections), getting treated can help reduce your risk for chlamydia, gonorrhea, herpes, and HIV (human immunodeficiency virus). Treatment is also important for preventing complications in pregnant women, because the condition can cause an early (premature) delivery.  This condition is treated with antibiotic medicines. These may be given as a pill, a vaginal cream, or a medicine that is put into the vagina (suppository). This information is not intended to replace advice given to you by your health  care provider. Make sure you discuss any questions you have with your health care provider. Document Released: 05/13/2005 Document Revised: 01/27/2016 Document Reviewed: 01/27/2016 Elsevier Interactive Patient Education  2017 Elsevier Inc.   Boric Acid vaginal suppository What is this medicine? BORIC ACID (BOHR ik AS id) helps to promote the proper acid balance in the vagina. It is used to help treat yeast infections of the vagina and relieve symptoms such as itching and burning. This medicine may be used for other purposes; ask your health care provider or pharmacist if you have questions. COMMON BRAND NAME(S): Hylafem What should I tell my health care provider before I take this medicine? They need to know if you have any of these conditions: -diabetes -frequent infections -HIV or AIDS -immune system problems -an unusual or allergic reaction to boric acid, other medicines, foods, dyes, or preservatives -pregnant or trying to get pregnant -breast-feeding How should I use this medicine? This medicine is for use in the vagina. Do not take by mouth. Follow the directions on the prescription label. Read package directions carefully before using. Wash hands before and after use. Use this medicine at bedtime, unless otherwise directed by your doctor. Do not use your medicine more often than directed. Do not stop using this medicine except on your doctor's advice. Talk to your pediatrician regarding the use of this medicine in children. This medicine is not approved for use in  children. Overdosage: If you think you have taken too much of this medicine contact a poison control center or emergency room at once. NOTE: This medicine is only for you. Do not share this medicine with others. What if I miss a dose? If you miss a dose, use it as soon as you can. If it is almost time for your next dose, use only that dose. Do not use double or extra doses. What may interact with this  medicine? Interactions are not expected. Do not use any other vaginal products without telling your doctor or health care professional. This list may not describe all possible interactions. Give your health care provider a list of all the medicines, herbs, non-prescription drugs, or dietary supplements you use. Also tell them if you smoke, drink alcohol, or use illegal drugs. Some items may interact with your medicine. What should I watch for while using this medicine? Tell your doctor or health care professional if your symptoms do not start to get better within a few days. It is better not to have sex until you have finished your treatment. This medicine may damage condoms or diaphragms and cause them not to work properly. It may also decrease the effect of vaginal spermicides. Do not rely on any of these methods to prevent sexually transmitted diseases or pregnancy while you are using this medicine. Vaginal medicines usually will come out of the vagina during treatment. To keep the medicine from getting on your clothing, wear a panty liner. The use of tampons is not recommended. To help clear up the infection, wear freshly washed cotton, not synthetic, underwear. What side effects may I notice from receiving this medicine? Side effects that you should report to your doctor or health care professional as soon as possible: -allergic reactions like skin rash, itching or hives -vaginal irritation, redness, or burning Side effects that usually do not require medical attention (report to your doctor or health care professional if they continue or are bothersome): -vaginal discharge This list may not describe all possible side effects. Call your doctor for medical advice about side effects. You may report side effects to FDA at 1-800-FDA-1088. Where should I keep my medicine? Keep out of the reach of children. Store in a cool, dry place between 15 and 30 degrees C (59 and 86 degrees F). Keep away from  sunlight. Throw away any unused medicine after the expiration date. NOTE: This sheet is a summary. It may not cover all possible information. If you have questions about this medicine, talk to your doctor, pharmacist, or health care provider.  2018 Elsevier/Gold Standard (2015-06-15 07:29:58)  IF you received an x-ray today, you will receive an invoice from [facility]. Please contact [facility] at [phone] with questions or concerns regarding your invoice.   IF you received labwork today, you will receive an invoice from [facility]. Please contact [facility] at [phone] with questions or concerns regarding your invoice.   Our billing staff will not be able to assist you with questions regarding bills from these companies.  You will be contacted with the lab results as soon as they are available. The fastest way to get your results is to activate your My Chart account. Instructions are located on the last page of this paperwork. If you have not heard from [place] regarding the results in 2 weeks, please contact this office.

## 2017-03-18 LAB — URINE CULTURE

## 2017-03-20 ENCOUNTER — Telehealth: Payer: Self-pay

## 2017-03-20 NOTE — Telephone Encounter (Signed)
-----   Message from Magdalene RiverBrittany D Wiseman, PA-C sent at 03/20/2017  8:19 AM EDT ----- Please call pt and report her urine culture shows less than 10,000 colony forming units of bacteria, which does not generally require treatment. Since she is not having any urinary symptoms, I recommend she just continue with plan from her last OV. Please let me know if she has any questions. Thanks!

## 2017-03-20 NOTE — Telephone Encounter (Signed)
Call placed to make patient aware of her results. Patient verbalized understanding, thanked office for the call./ S.Reymond Maynez,CMA

## 2017-04-08 ENCOUNTER — Ambulatory Visit: Payer: No Typology Code available for payment source | Admitting: Physician Assistant

## 2017-04-10 ENCOUNTER — Ambulatory Visit: Payer: No Typology Code available for payment source | Admitting: Physician Assistant

## 2017-04-12 ENCOUNTER — Encounter: Payer: Self-pay | Admitting: Physician Assistant

## 2017-04-12 ENCOUNTER — Ambulatory Visit (INDEPENDENT_AMBULATORY_CARE_PROVIDER_SITE_OTHER): Payer: No Typology Code available for payment source | Admitting: Physician Assistant

## 2017-04-12 VITALS — BP 110/62 | HR 88 | Resp 16 | Ht 60.0 in | Wt 156.0 lb

## 2017-04-12 DIAGNOSIS — B9689 Other specified bacterial agents as the cause of diseases classified elsewhere: Secondary | ICD-10-CM | POA: Diagnosis not present

## 2017-04-12 DIAGNOSIS — N760 Acute vaginitis: Secondary | ICD-10-CM | POA: Diagnosis not present

## 2017-04-12 DIAGNOSIS — Z30019 Encounter for initial prescription of contraceptives, unspecified: Secondary | ICD-10-CM

## 2017-04-12 LAB — POCT WET + KOH PREP
TRICH BY WET PREP: ABSENT
YEAST BY KOH: ABSENT
YEAST BY WET PREP: ABSENT

## 2017-04-12 LAB — POCT URINE PREGNANCY: Preg Test, Ur: NEGATIVE

## 2017-04-12 MED ORDER — METRONIDAZOLE 0.75 % VA GEL: VAGINAL | 3 refills | Status: DC

## 2017-04-12 MED ORDER — LEVONORGESTREL-ETHINYL ESTRAD 0.1-20 MG-MCG PO TABS
1.0000 | ORAL_TABLET | Freq: Every day | ORAL | 4 refills | Status: DC
Start: 2017-04-12 — End: 2017-09-16

## 2017-04-12 NOTE — Progress Notes (Signed)
Crystal Gelinaskea Pompey  MRN: [id number] DOB: 10/17/1985  Subjective:  Crystal Dominguez is a 31 y.o. female seen in office today for a chief complaint of follow up on recurrent BV treatment. She last saw me in office on 03/17/17. After having multiple BV recurrences over the past year, it was decided at that visit to start suppressive therapy with flagyl x 7 days along with boric acid x 21 days.  Please refer to that OV note for further details. Today, pt reports she has successfully completed the regimen. Notes she tolerated it well. Denies current vaginal discharge, vaginal odor, or genital irritation.  She would also like to discuss starting birth control. She has had OCPs in the past and has done very well with them. She would like to try them again. She is sexually active with monogamous partner. LMP 03/21/17. Denies smoking. She denies personal hx of blood clots, breast cancer, migraines, and hypertension.  No family history of heart disease, stroke, or blood clots.   Review of Systems  Constitutional: Negative for chills and fever.  Gastrointestinal: Negative for abdominal pain, nausea and vomiting.  Genitourinary: Negative for dysuria, frequency and hematuria.    There are no active problems to display for this patient.   No current outpatient medications on file prior to visit.   No current facility-administered medications on file prior to visit.     Allergies  Allergen Reactions  . Shellfish-Derived Products     Crabmeat      Social History   Socioeconomic History  . Marital status: Single    Spouse name: Not on file  . Number of children: Not on file  . Years of education: Not on file  . Highest education level: Not on file  Social Needs  . Financial resource strain: Not on file  . Food insecurity - worry: Not on file  . Food insecurity - inability: Not on file  . Transportation needs - medical: Not on file  . Transportation needs - non-medical: Not on file  Occupational  History  . Not on file  Tobacco Use  . Smoking status: Never Smoker  . Smokeless tobacco: Never Used  Substance and Sexual Activity  . Alcohol use: Yes    Alcohol/week: 1.8 oz    Types: 3 Glasses of wine per week  . Drug use: No  . Sexual activity: Yes    Partners: Male    Birth control/protection: None  Other Topics Concern  . Not on file  Social History Narrative   She is from [place]. 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 She is currently in a relationship. She is sexually active with monogamous partner.     Objective:  BP 110/62   Pulse 88   Resp 16   Ht 5' (1.524 m)   Wt 156 lb (70.8 kg)   LMP 03/21/2017   SpO2 98%   BMI 30.47 kg/m   Physical Exam  Constitutional: She is oriented to person, place, and time and well-developed, well-nourished, and in no distress.  HENT:  Head: Normocephalic and atraumatic.  Eyes: Conjunctivae are normal.  Neck: Normal range of motion.  Pulmonary/Chest: Effort normal.  Genitourinary: Vagina normal. No vaginal discharge found.  Neurological: She is alert and oriented to person, place, and time. Gait normal.  Skin: Skin is warm and dry.  Psychiatric: Affect normal.  Vitals reviewed.    Results for orders placed or performed in visit on 04/12/17 (from the past 24 hour(s))  POCT  urine pregnancy     Status: None   Collection Time: 04/12/17 11:08 AM  Result Value Ref Range   Preg Test, Ur Negative Negative  POCT Wet + KOH Prep     Status: Abnormal   Collection Time: 04/12/17 11:48 AM  Result Value Ref Range   Yeast by KOH Absent Absent   Yeast by wet prep Absent Absent   WBC by wet prep Few Few   Clue Cells Wet Prep HPF POC None None   Trich by wet prep Absent Absent   Bacteria Wet Prep HPF POC Few Few   Epithelial Cells By Principal FinancialWet Pref ([facility]) Many (A) None, Few, Too numerous to count   RBC,UR,HPF,POC None None RBC/hpf   Assessment and Plan :  1. BV (bacterial vaginosis) Pt is asymptomatic. KOH Wet Prep  shows no clue cells. She appears to be in remission of BV at this time. Will therefore continue with suppressive treatment regimen of metrogel vaginally twice weekly for 4-6 months. Return in 4-6 months for reevaluation.  - POCT Wet + KOH Prep - metroNIDAZOLE (METROGEL VAGINAL) 0.75 % vaginal gel; Place 1 applicator 2 times a week for four months.  Dispense: 70 g; Refill: 3  2. Encounter for initial prescription of contraceptives, unspecified contraceptive Negative pregnancy test. Will initiate oral contraceptives at this time. Educated patient on how to take medication and potential side effects. Follow up as needed.  - POCT urine pregnancy - levonorgestrel-ethinyl estradiol (AVIANE) 0.1-20 MG-MCG tablet; Take 1 tablet daily by mouth.  Dispense: 3 Package; Refill: 4  Benjiman CoreBrittany Chike Farrington PA-C  [facility] 04/12/2017 11:20 AM

## 2017-04-12 NOTE — Patient Instructions (Signed)
Your wet prep today was completely normal.  Your pregnancy test was negative.  Since you have no clue cells on your wet prep, we are going to continue with suppressive therapy for the next 4-6 months.  For suppressive therapy, you are going to use the MetroGel applicator twice a week for the next 4-6 months.  We can then discontinue therapy at that time.  Please follow-up in 4 months for reevaluation.  In terms of birth control, I have given you a prescription for a generic monophasic tablet.   you can either begin today or start the Sunday after her next cycle.  Please  use a backup method such as condoms for the first 7 days of using birth control.  I have given you enough for a years worth of prescriptions.  Thank you for letting me participate in your health and well being.

## 2017-05-12 ENCOUNTER — Telehealth: Payer: Self-pay

## 2017-05-12 NOTE — Telephone Encounter (Signed)
Do you want to see patient

## 2017-05-12 NOTE — Telephone Encounter (Signed)
Copied from CRM #[id number]. Topic: General - Other >> May 12, 2017 12:08 PM Lelon FrohlichGolden, Tashia, [place] wrote: Pt would like a medication sent for UTI she stated that they are frequent for her please call pt at [phone]

## 2017-05-13 NOTE — Telephone Encounter (Signed)
Please call patient and inform her that she will need to come into the office as she has a complicated history of urinary complaints.  She will need testing to ensure that this is actually a UTI.  I am in the office on Friday.  If she needs an appointment sooner, she can schedule a different provider. Thanks!

## 2017-05-14 NOTE — Telephone Encounter (Signed)
Pt states she followed up with OB.

## 2017-05-27 NOTE — L&D Delivery Note (Signed)
Delivery Note At 5:36 PM a viable and healthy female was delivered via Vaginal, Spontaneous (Presentation: direct occiput ; anterior  ).  APGAR: 2, 8; weight 4 lb 10.3 oz (2106 g).   Placenta status: Spintaneous , intact.  Cord: 3V   The baby delivered quickly with maternal pushing and was passed to the maternal abdomen. The infant initially appeared stunned and the cord was clamed and cut and passed to the isolette. Code apgar called and the baby responded quickly to resuscitation efforts.  Anesthesia:  Epidural  Episiotomy:  none Lacerations:  first Suture Repair: 3.0 vicryl Est. Blood Loss (mL):  150 cc  Mom to postpartum.  Baby to Couplet care / Skin to Skin.  Waynard Reeds 01/28/2018, 5:59 PM

## 2017-06-26 LAB — OB RESULTS CONSOLE GC/CHLAMYDIA
CHLAMYDIA, DNA PROBE: NEGATIVE
GC PROBE AMP, GENITAL: NEGATIVE

## 2017-07-17 LAB — OB RESULTS CONSOLE HIV ANTIBODY (ROUTINE TESTING): HIV: NONREACTIVE

## 2017-07-17 LAB — OB RESULTS CONSOLE HEPATITIS B SURFACE ANTIGEN: Hepatitis B Surface Ag: NEGATIVE

## 2017-07-17 LAB — OB RESULTS CONSOLE RUBELLA ANTIBODY, IGM: Rubella: IMMUNE

## 2017-08-25 ENCOUNTER — Other Ambulatory Visit: Payer: Self-pay

## 2017-08-25 ENCOUNTER — Encounter: Payer: Self-pay | Admitting: Physician Assistant

## 2017-08-25 ENCOUNTER — Ambulatory Visit: Payer: No Typology Code available for payment source | Admitting: Physician Assistant

## 2017-08-25 VITALS — BP 120/70 | HR 98 | Temp 99.0°F | Resp 20 | Ht 60.71 in | Wt 166.2 lb

## 2017-08-25 DIAGNOSIS — N898 Other specified noninflammatory disorders of vagina: Secondary | ICD-10-CM

## 2017-08-25 DIAGNOSIS — R82998 Other abnormal findings in urine: Secondary | ICD-10-CM

## 2017-08-25 LAB — POCT WET + KOH PREP
Trich by wet prep: ABSENT
YEAST BY KOH: ABSENT
YEAST BY WET PREP: ABSENT

## 2017-08-25 LAB — POCT URINALYSIS DIP (MANUAL ENTRY)
Bilirubin, UA: NEGATIVE
Blood, UA: NEGATIVE
Glucose, UA: NEGATIVE mg/dL
Ketones, POC UA: NEGATIVE mg/dL
NITRITE UA: NEGATIVE
PH UA: 6 (ref 5.0–8.0)
Protein Ur, POC: NEGATIVE mg/dL
Spec Grav, UA: >=1.03 — AB (ref 1.010–1.025)
UROBILINOGEN UA: 0.2 U/dL

## 2017-08-25 LAB — POC MICROSCOPIC URINALYSIS (UMFC): Mucus: ABSENT

## 2017-08-25 NOTE — Patient Instructions (Signed)
     IF you received an x-ray today, you will receive an invoice from [facility]. Please contact [facility] at [phone] with questions or concerns regarding your invoice.   IF you received labwork today, you will receive an invoice from [facility]. Please contact [facility] at [phone] with questions or concerns regarding your invoice.   Our billing staff will not be able to assist you with questions regarding bills from these companies.  You will be contacted with the lab results as soon as they are available. The fastest way to get your results is to activate your My Chart account. Instructions are located on the last page of this paperwork. If you have not heard from us regarding the results in 2 weeks, please contact this office.     

## 2017-08-25 NOTE — Progress Notes (Signed)
08/26/2017 at 10:11 AM  Crystal Dominguez / DOB: 06/21/1985 / MRN: [id number]  The patient does not have a problem list on file.  SUBJECTIVE  Crystal Dominguez is a 32 y.o. female who complains of vaginal discharge x 7 days. She denies vaginal bleeding, dysuria, hematuria, urinary frequency, urinary urgency, flank pain, abdominal pain, pelvic pain, cloudy malordorous urine, genital rash and genital irritation. Discharge is white and stringy. Of note, pt is [redacted] weeks pregnant. She has hx of BV and just got concerned so she wanted to get evaluated. Seems like normal discharge to her but she just wanted to check. She is still sexually active with former monogamous partner.  She is following up with gynecologist regularly.  Review of Systems  Constitutional: Negative for chills and fever.  Gastrointestinal: Negative for nausea and vomiting.    OBJECTIVE  Her  height is 5' 0.71" (1.542 m) and weight is 166 lb 3.2 oz (75.4 kg). Her oral temperature is 99 F (37.2 C). Her blood pressure is 120/70 and her pulse is 98. Her respiration is 20 and oxygen saturation is 99%.  The patient's body mass index is 31.7 kg/m.  Physical Exam  Constitutional: She is oriented to person, place, and time. She appears well-developed and well-nourished. No distress.  HENT:  Head: Normocephalic and atraumatic.  Eyes: Conjunctivae are normal.  Neck: Normal range of motion.  Pulmonary/Chest: Effort normal.  Abdominal: Soft. Normal appearance. There is no tenderness.  Genitourinary: No erythema or bleeding in the vagina. Vaginal discharge (mild amount of whitish-clear dischage in vaginal canal) found.  Neurological: She is alert and oriented to person, place, and time.  Skin: Skin is warm and dry.  Psychiatric: She has a normal mood and affect.  Vitals reviewed.   Results for orders placed or performed in visit on 08/25/17 (from the past 24 hour(s))  POCT urinalysis dipstick     Status: Abnormal   Collection Time:  08/25/17  5:20 PM  Result Value Ref Range   Color, UA yellow yellow   Clarity, UA cloudy (A) clear   Glucose, UA negative negative mg/dL   Bilirubin, UA negative negative   Ketones, POC UA negative negative mg/dL   Spec Grav, UA >=4.098 (A) 1.010 - 1.025   Blood, UA negative negative   pH, UA 6.0 5.0 - 8.0   Protein Ur, POC negative negative mg/dL   Urobilinogen, UA 0.2 0.2 or 1.0 E.U./dL   Nitrite, UA Negative Negative   Leukocytes, UA Trace (A) Negative  POCT Wet + KOH Prep     Status: Abnormal   Collection Time: 08/25/17  5:42 PM  Result Value Ref Range   Yeast by KOH Absent Absent   Yeast by wet prep Absent Absent   WBC by wet prep Few Few   Clue Cells Wet Prep HPF POC None None   Trich by wet prep Absent Absent   Bacteria Wet Prep HPF POC Many (A) Few   Epithelial Cells By [employer] Financial Pref ([facility]) Moderate (A) None, Few, Too numerous to count   RBC,UR,HPF,POC None None RBC/hpf  POCT Microscopic Urinalysis ([facility])     Status: Abnormal   Collection Time: 08/25/17  5:58 PM  Result Value Ref Range   WBC,UR,HPF,POC Few (A) None WBC/hpf   RBC,UR,HPF,POC None None RBC/hpf   Bacteria Many (A) None, Too numerous to count   Mucus Absent Absent   Epithelial Cells, UR Per Microscopy Few (A) None, Too numerous to count cells/hpf    ASSESSMENT &  PLAN  Crystal Dominguez was seen today for vaginal discharge.  Diagnoses and all orders for this visit:  Vaginal discharge -     POCT urinalysis dipstick -     POCT Wet + KOH Prep  Leukocytes in urine -     POCT Microscopic Urinalysis ([facility]) -     Urine Culture  Vaginal discharge is normal in appearance. POC wet prep neg for yeast, clue cells, and trich. Pt reassured. UA did have trace leuks and few WBCs.She is asymptomatic. Due to pregnancy, will send urine cx. If urine cx positive, will treat with abx. The patient was advised to call or come back to clinic if she does not see an improvement in symptoms, or worsens with the above plan.   Crystal CoreBrittany Alonte Wulff,  PA-C  [facility] 08/26/2017 10:11 AM

## 2017-08-26 ENCOUNTER — Encounter: Payer: Self-pay | Admitting: Physician Assistant

## 2017-08-27 LAB — URINE CULTURE: Organism ID, Bacteria: NO GROWTH

## 2017-09-16 ENCOUNTER — Inpatient Hospital Stay (HOSPITAL_COMMUNITY)
Admission: AD | Admit: 2017-09-16 | Discharge: 2017-09-16 | Disposition: A | Discharge status: Home / Self Care | Payer: No Typology Code available for payment source | Source: Ambulatory Visit | Attending: Obstetrics and Gynecology | Admitting: Obstetrics and Gynecology

## 2017-09-16 ENCOUNTER — Other Ambulatory Visit: Payer: Self-pay

## 2017-09-16 ENCOUNTER — Encounter (HOSPITAL_COMMUNITY): Payer: Self-pay | Admitting: *Deleted

## 2017-09-16 ENCOUNTER — Inpatient Hospital Stay (HOSPITAL_COMMUNITY): Payer: No Typology Code available for payment source

## 2017-09-16 DIAGNOSIS — O4692 Antepartum hemorrhage, unspecified, second trimester: Secondary | ICD-10-CM

## 2017-09-16 DIAGNOSIS — O26892 Other specified pregnancy related conditions, second trimester: Secondary | ICD-10-CM | POA: Insufficient documentation

## 2017-09-16 DIAGNOSIS — N939 Abnormal uterine and vaginal bleeding, unspecified: Secondary | ICD-10-CM | POA: Diagnosis present

## 2017-09-16 DIAGNOSIS — Z3A19 19 weeks gestation of pregnancy: Secondary | ICD-10-CM | POA: Diagnosis not present

## 2017-09-16 DIAGNOSIS — Z820 Family history of epilepsy and other diseases of the nervous system: Secondary | ICD-10-CM | POA: Diagnosis not present

## 2017-09-16 DIAGNOSIS — O209 Hemorrhage in early pregnancy, unspecified: Secondary | ICD-10-CM | POA: Diagnosis not present

## 2017-09-16 DIAGNOSIS — Z91013 Allergy to seafood: Secondary | ICD-10-CM | POA: Diagnosis not present

## 2017-09-16 DIAGNOSIS — R109 Unspecified abdominal pain: Secondary | ICD-10-CM | POA: Diagnosis not present

## 2017-09-16 HISTORY — DX: Other specified health status: Z78.9

## 2017-09-16 LAB — URINALYSIS, ROUTINE W REFLEX MICROSCOPIC
BILIRUBIN URINE: NEGATIVE
Hgb urine dipstick: NEGATIVE
Ketones, ur: 5 mg/dL — AB
Nitrite: NEGATIVE
Protein, ur: NEGATIVE mg/dL
Specific Gravity, Urine: 1.018 (ref 1.005–1.030)
pH: 6 (ref 5.0–8.0)

## 2017-09-16 NOTE — MAU Provider Note (Signed)
Chief Complaint:  Vaginal Bleeding and Abdominal Pain   First Provider Initiated Contact with Patient 09/16/17 2024     HPI: Crystal Dominguez is a 32 y.o. G1P0 at 41w5dwho presents to maternity admissions reporting pink vaginal discharge today. Has had some cramping today. Has not really had this happen before. . She reports good fetal movement, denies LOF, vaginal itching/burning, urinary symptoms, h/a, dizziness, n/v, diarrhea, constipation or fever/chills  Vaginal Bleeding  The patient's primary symptoms include pelvic pain (cramping earlier, none now) and vaginal bleeding (pink). The patient's pertinent negatives include no genital itching, genital lesions or genital odor. This is a new problem. The current episode started today. The problem has been resolved. The patient is experiencing no pain. She is pregnant. Associated symptoms include abdominal pain. Pertinent negatives include no constipation, diarrhea, fever, headaches, nausea or vomiting. Vaginal discharge characteristics: pink. The vaginal bleeding is spotting. She has not been passing clots. She has not been passing tissue. Nothing aggravates the symptoms. She has tried nothing for the symptoms.  Abdominal Pain  This is a new problem. The current episode started today. The onset quality is gradual. The problem occurs intermittently. The problem has been resolved. The pain is located in the suprapubic region. The patient is experiencing no pain. The quality of the pain is cramping. The abdominal pain does not radiate. Pertinent negatives include no constipation, diarrhea, fever, headaches, nausea or vomiting. Nothing aggravates the pain. The pain is relieved by nothing. She has tried nothing for the symptoms.   .  RN Note: Saw pink when she wiped.  Having a little cramping.  Dr said they would do an [place] to check cervical length     Past Medical History: Past Medical History:  Diagnosis Date  . Medical history non-contributory      Past obstetric history: OB History  Gravida Para Term Preterm AB Living  1            SAB TAB Ectopic Multiple Live Births               # Outcome Date GA Lbr Len/2nd Weight Sex Delivery Anes PTL Lv  1 Current             Past Surgical History: Past Surgical History:  Procedure Laterality Date  . NO PAST SURGERIES      Family History: Family History  Problem Relation Age of Onset  . Epilepsy Father     Social History: Social History   Tobacco Use  . Smoking status: Never Smoker  . Smokeless tobacco: Never Used  Substance Use Topics  . Alcohol use: Not Currently    Alcohol/week: 1.8 oz    Types: 3 Glasses of wine per week  . Drug use: No    Allergies:  Allergies  Allergen Reactions  . Shellfish-Derived Products     Crabmeat    Meds:  Medications Prior to Admission  Medication Sig Dispense Refill Last Dose  . Prenatal Vit-Fe Fumarate-FA (PRENATAL MULTIVITAMIN) TABS tablet Take 1 tablet by mouth daily at 12 noon.   09/16/2017 at Unknown time  . levonorgestrel-ethinyl estradiol (AVIANE) 0.1-20 MG-MCG tablet Take 1 tablet daily by mouth. (Patient not taking: Reported on 08/25/2017) 3 Package 4 Not Taking  . metroNIDAZOLE (METROGEL VAGINAL) 0.75 % vaginal gel Place 1 applicator 2 times a week for four months. (Patient not taking: Reported on 08/25/2017) 70 g 3 Not Taking    I have reviewed patient's Past Medical Hx, Surgical Hx, Family Hx, Social Hx,  medications and allergies.   ROS:  Review of Systems  Constitutional: Negative for fever.  Gastrointestinal: Positive for abdominal pain. Negative for constipation, diarrhea, nausea and vomiting.  Genitourinary: Positive for pelvic pain (cramping earlier, none now) and vaginal bleeding.  Neurological: Negative for headaches.   Other systems negative  Physical Exam   Patient Vitals for the past 24 hrs:  BP Temp Temp src Pulse Resp SpO2 Weight  09/16/17 1743 119/73 98.9 F (37.2 C) Oral 88 16 100 % 171 lb 12 oz  (77.9 kg)   Constitutional: Well-developed, well-nourished female in no acute distress.  Cardiovascular: normal rate and rhythm Respiratory: normal effort, clear to auscultation bilaterally GI: Abd soft, non-tender, gravid appropriate for gestational age.   No rebound or guarding. MS: Extremities nontender, no edema, normal ROM Neurologic: Alert and oriented x 4.  GU: Neg CVAT.  PELVIC EXAM: Cervix pink, visually closed, without lesion, scant white creamy discharge, vaginal walls and external genitalia normal Cervix long and closed, firm   FHT:   145  Labs: Results for orders placed or performed during the hospital encounter of 09/16/17 (from the past 24 hour(s))  Urinalysis, Routine w reflex microscopic     Status: Abnormal   Collection Time: 09/16/17  5:30 PM  Result Value Ref Range   Color, Urine YELLOW YELLOW   APPearance CLEAR CLEAR   Specific Gravity, Urine 1.018 1.005 - 1.030   pH 6.0 5.0 - 8.0   Glucose, UA >=500 (A) NEGATIVE mg/dL   Hgb urine dipstick NEGATIVE NEGATIVE   Bilirubin Urine NEGATIVE NEGATIVE   Ketones, ur 5 (A) NEGATIVE mg/dL   Protein, ur NEGATIVE NEGATIVE mg/dL   Nitrite NEGATIVE NEGATIVE   Leukocytes, UA TRACE (A) NEGATIVE   RBC / HPF 0-5 0 - 5 RBC/hpf   WBC, UA 0-5 0 - 5 WBC/hpf   Bacteria, UA RARE (A) NONE SEEN   Squamous Epithelial / LPF 0-5 0 - 5   Mucus PRESENT       Imaging:  Cervical Length 3.6cm AF pocket 5.1  MAU Course/MDM: I have ordered labs and reviewed results.  US reviewed as above Consult Dr Dareen PianoAnderson with presentation, exam findings and test results. He recommends discharge home Treatments in MAU included none.    Assessment: 1. Vaginal bleeding in pregnancy, second trimester   2. Vaginal bleeding in pregnancy, second trimester   3.     Normal cervical length at 565w5d   Plan: Discharge home Pelvic rest Preterm Labor precautions and fetal kick counts Follow up in Office for prenatal visits and recheck of status   Encouraged to return here or to other Urgent Care/ED if she develops worsening of symptoms, increase in pain, fever, or other concerning symptoms.   Pt stable at time of discharge.  Wynelle BourgeoisMarie Doris Gruhn CNM, MSN Certified Nurse-Midwife 09/16/2017 8:24 PM

## 2017-09-16 NOTE — Discharge Instructions (Signed)
Pelvic Rest °Pelvic rest may be recommended if: °· Your placenta is partially or completely covering the opening of your cervix (placenta previa). °· There is bleeding between the wall of the uterus and the amniotic sac in the first trimester of pregnancy (subchorionic hemorrhage). °· You went into labor too early (preterm labor). ° °Based on your overall health and the health of your baby, your health care provider will decide if pelvic rest is right for you. °How do I rest my pelvis? °For as long as told by your health care provider: °· Do not have sex, sexual stimulation, or an orgasm. °· Do not use tampons. Do not douche. Do not put anything in your vagina. °· Do not lift anything that is heavier than 10 lb (4.5 kg). °· Avoid activities that take a lot of effort (are strenuous). °· Avoid any activity in which your pelvic muscles could become strained. ° °When should I seek medical care? °Seek medical care if you have: °· Cramping pain in your lower abdomen. °· Vaginal discharge. °· A low, dull backache. °· Regular contractions. °· Uterine tightening. ° °When should I seek immediate medical care? °Seek immediate medical care if: °· You have vaginal bleeding and you are pregnant. ° °This information is not intended to replace advice given to you by your health care provider. Make sure you discuss any questions you have with your health care provider. °Document Released: 09/07/2010 Document Revised: 10/19/2015 Document Reviewed: 11/14/2014 °Elsevier Interactive Patient Education © 2018 Elsevier Inc. ° ° °Vaginal Bleeding During Pregnancy, Second Trimester °A small amount of bleeding (spotting) from the vagina is relatively common in pregnancy. It usually stops on its own. Various things can cause bleeding or spotting in pregnancy. Some bleeding may be related to the pregnancy, and some may not. Sometimes the bleeding is normal and is not a problem. However, bleeding can also be a sign of something serious. Be sure  to tell your health care provider about any vaginal bleeding right away. °Some possible causes of vaginal bleeding during the second trimester include: °· Infection, inflammation, or growths on the cervix. °· The placenta may be partially or completely covering the opening of the cervix inside the uterus (placenta previa). °· The placenta may have separated from the uterus (abruption of the placenta). °· You may be having early (preterm) labor. °· The cervix may not be strong enough to keep a baby inside the uterus (cervical insufficiency). °· Tiny cysts may have developed in the uterus instead of pregnancy tissue (molar pregnancy). ° °Follow these instructions at home: °Watch your condition for any changes. The following actions may help to lessen any discomfort you are feeling: °· Follow your health care provider's instructions for limiting your activity. If your health care provider orders bed rest, you may need to stay in bed and only get up to use the bathroom. However, your health care provider may allow you to continue light activity. °· If needed, make plans for someone to help with your regular activities and responsibilities while you are on bed rest. °· Keep track of the number of pads you use each day, how often you change pads, and how soaked (saturated) they are. Write this down. °· Do not use tampons. Do not douche. °· Do not have sexual intercourse or orgasms until approved by your health care provider. °· If you pass any tissue from your vagina, save the tissue so you can show it to your health care provider. °· Only take over-the-counter   or prescription medicines as directed by your health care provider. °· Do not take aspirin because it can make you bleed. °· Do not exercise or perform any strenuous activities or heavy lifting without your health care provider's permission. °· Keep all follow-up appointments as directed by your health care provider. ° °Contact a health care provider if: °· You  have any vaginal bleeding during any part of your pregnancy. °· You have cramps or labor pains. °· You have a fever, not controlled by medicine. °Get help right away if: °· You have severe cramps in your back or belly (abdomen). °· You have contractions. °· You have chills. °· You pass large clots or tissue from your vagina. °· Your bleeding increases. °· You feel light-headed or weak, or you have fainting episodes. °· You are leaking fluid or have a gush of fluid from your vagina. °This information is not intended to replace advice given to you by your health care provider. Make sure you discuss any questions you have with your health care provider. °Document Released: 02/20/2005 Document Revised: 10/19/2015 Document Reviewed: 01/18/2013 °Elsevier Interactive Patient Education © 2018 Elsevier Inc. ° °

## 2017-09-16 NOTE — MAU Note (Addendum)
Saw pink when she wiped.  Having a little cramping.  Dr said they would do an US to check cervical length

## 2017-09-16 NOTE — MAU Note (Signed)
Urine sent to lab 

## 2017-10-24 ENCOUNTER — Ambulatory Visit (INDEPENDENT_AMBULATORY_CARE_PROVIDER_SITE_OTHER): Payer: No Typology Code available for payment source | Admitting: Family Medicine

## 2017-10-24 ENCOUNTER — Encounter: Payer: Self-pay | Admitting: Family Medicine

## 2017-10-24 ENCOUNTER — Other Ambulatory Visit: Payer: Self-pay

## 2017-10-24 VITALS — BP 100/60 | HR 94 | Temp 99.0°F | Resp 16 | Ht 61.0 in | Wt 174.0 lb

## 2017-10-24 DIAGNOSIS — J209 Acute bronchitis, unspecified: Secondary | ICD-10-CM | POA: Diagnosis not present

## 2017-10-24 DIAGNOSIS — R05 Cough: Secondary | ICD-10-CM

## 2017-10-24 DIAGNOSIS — R0982 Postnasal drip: Secondary | ICD-10-CM | POA: Diagnosis not present

## 2017-10-24 DIAGNOSIS — R059 Cough, unspecified: Secondary | ICD-10-CM

## 2017-10-24 MED ORDER — AZITHROMYCIN 250 MG PO TABS: ORAL_TABLET | ORAL | 0 refills | Status: AC

## 2017-10-24 MED ORDER — FLUTICASONE PROPIONATE 50 MCG/ACT NA SUSP: 1.0000 | Freq: Two times a day (BID) | NASAL | 0 refills | Status: DC

## 2017-10-24 NOTE — Progress Notes (Signed)
5/31/20195:58 PM  Crystal Dominguez 03/27/1986, 31 y.o. female [id number]  Chief Complaint  Patient presents with  . Cough     x 7 days, green mucus x 4 days, cough is better, flowers brought into the office on yesterday, start to cough and couldn't stop. Pt is 6 months pregnant.    HPI:   Patient is a 32 y.o. female  who presents today for 1 week of productive cough, at times yellow green, worse at night Positive chest congestion and PND No sinus pain, no nasal congestion, no sneezing or itchiness No fever or chills No SOB, nausea or vomiting No h/o asthma, but thinks she has heard herself wheeze at night Has tried robitussin and zyrtec Currently better   6 months pregnant, sees OB on the 17th  Fall Risk  10/24/2017 08/25/2017 03/17/2017 01/18/2017 12/18/2016  Falls in the past year? No No No No No     Depression screen [facility] 2/9 10/24/2017 08/25/2017 03/17/2017  Decreased Interest 0 0 0  Down, Depressed, Hopeless 0 0 0  PHQ - 2 Score 0 0 0    Allergies  Allergen Reactions  . Shellfish-Derived Products     Crabmeat    Prior to Admission medications   Medication Sig Start Date End Date Taking? Authorizing Provider  Prenatal Vit-Fe Fumarate-FA (PRENATAL MULTIVITAMIN) TABS tablet Take 1 tablet by mouth daily at 12 noon.   Yes [provider]    Past Medical History:  Diagnosis Date  . Medical history non-contributory     Past Surgical History:  Procedure Laterality Date  . NO PAST SURGERIES      Social History   Tobacco Use  . Smoking status: Never Smoker  . Smokeless tobacco: Never Used  Substance Use Topics  . Alcohol use: Not Currently    Alcohol/week: 1.8 oz    Types: 3 Glasses of wine per week    Family History  Problem Relation Age of Onset  . Epilepsy Father     ROS Per HPI  OBJECTIVE:  Blood pressure 100/60, pulse 94, temperature 99 F (37.2 C), temperature source Oral, resp. rate 16, height  (1.549 m), weight 174 lb (78.9 kg), last  menstrual period 03/21/2017, SpO2 99 %.  Physical Exam  Constitutional: She is oriented to person, place, and time. She appears well-developed and well-nourished.  HENT:  Head: Normocephalic and atraumatic.  Right Ear: Hearing, tympanic membrane, external ear and ear canal normal.  Left Ear: Hearing, tympanic membrane, external ear and ear canal normal.  Nose: Mucosal edema present. No rhinorrhea. Right sinus exhibits no maxillary sinus tenderness and no frontal sinus tenderness. Left sinus exhibits no maxillary sinus tenderness and no frontal sinus tenderness.  Mouth/Throat: Mucous membranes are normal. Posterior oropharyngeal erythema (and cobblestoning) present. No oropharyngeal exudate.  Eyes: Pupils are equal, round, and reactive to light. Conjunctivae and EOM are normal.  Neck: Neck supple.  Cardiovascular: Normal rate, regular rhythm and normal heart sounds. Exam reveals no gallop and no friction rub.  No murmur heard. Pulmonary/Chest: Effort normal and breath sounds normal. She has no wheezes. She has no rales.  Lymphadenopathy:    She has no cervical adenopathy.  Neurological: She is alert and oriented to person, place, and time.  Skin: Skin is warm and dry.  Nursing note and vitals reviewed.  Peak flow reading is 330, about 86 % of predicted, green zone  ASSESSMENT and PLAN   1. Acute bronchitis, unspecified organism 2. Cough 3. Postnasal drip Discussed supportive  measures, new meds r/se/b and RTC precautions. Patient educational handout given.  Other orders - Check Peak Flow - azithromycin (ZITHROMAX) 250 MG tablet; Take 2 tablets (500 mg total) by mouth daily for 1 day, THEN 1 tablet (250 mg total) daily for 4 days. - fluticasone (FLONASE) 50 MCG/ACT nasal spray; Place 1 spray into both nostrils 2 (two) times daily.  Return if symptoms worsen or fail to improve.    Myles Lipps, MD Primary Care at [facility] [address] [place], [place] [address] Ph.   [phone] Fax [phone]

## 2017-10-24 NOTE — Patient Instructions (Addendum)
     IF you received an x-ray today, you will receive an invoice from [facility]. Please contact [facility] at [phone] with questions or concerns regarding your invoice.   IF you received labwork today, you will receive an invoice from [facility]. Please contact [facility] at [phone] with questions or concerns regarding your invoice.   Our billing staff will not be able to assist you with questions regarding bills from these companies.  You will be contacted with the lab results as soon as they are available. The fastest way to get your results is to activate your My Chart account. Instructions are located on the last page of this paperwork. If you have not heard from us regarding the results in 2 weeks, please contact this office.     Acute Bronchitis, Adult Acute bronchitis is when air tubes (bronchi) in the lungs suddenly get swollen. The condition can make it hard to breathe. It can also cause these symptoms:  A cough.  Coughing up clear, yellow, or green mucus.  Wheezing.  Chest congestion.  Shortness of breath.  A fever.  Body aches.  Chills.  A sore throat.  Follow these instructions at home: Medicines  Take over-the-counter and prescription medicines only as told by your doctor.  If you were prescribed an antibiotic medicine, take it as told by your doctor. Do not stop taking the antibiotic even if you start to feel better. General instructions  Rest.  Drink enough fluids to keep your pee (urine) clear or pale yellow.  Avoid smoking and secondhand smoke. If you smoke and you need help quitting, ask your doctor. Quitting will help your lungs heal faster.  Use an inhaler, cool mist vaporizer, or humidifier as told by your doctor.  Keep all follow-up visits as told by your doctor. This is important. How is this prevented? To lower your risk of getting this condition again:  Wash your hands often with soap and water. If you cannot  use soap and water, use hand sanitizer.  Avoid contact with people who have cold symptoms.  Try not to touch your hands to your mouth, nose, or eyes.  Make sure to get the flu shot every year.  Contact a doctor if:  Your symptoms do not get better in 2 weeks. Get help right away if:  You cough up blood.  You have chest pain.  You have very bad shortness of breath.  You become dehydrated.  You faint (pass out) or keep feeling like you are going to pass out.  You keep throwing up (vomiting).  You have a very bad headache.  Your fever or chills gets worse. This information is not intended to replace advice given to you by your health care provider. Make sure you discuss any questions you have with your health care provider. Document Released: 10/30/2007 Document Revised: 12/20/2015 Document Reviewed: 11/01/2015 Elsevier Interactive Patient Education  2018 Elsevier Inc.  

## 2018-01-01 LAB — OB RESULTS CONSOLE GBS: GBS: NEGATIVE

## 2018-01-28 ENCOUNTER — Inpatient Hospital Stay (HOSPITAL_COMMUNITY): Payer: No Typology Code available for payment source | Admitting: Anesthesiology

## 2018-01-28 ENCOUNTER — Encounter (HOSPITAL_COMMUNITY): Payer: Self-pay | Admitting: *Deleted

## 2018-01-28 ENCOUNTER — Other Ambulatory Visit: Payer: Self-pay

## 2018-01-28 ENCOUNTER — Inpatient Hospital Stay (HOSPITAL_COMMUNITY)
Admission: AD | Admit: 2018-01-28 | Discharge: 2018-01-30 | DRG: 807 | Disposition: A | Discharge status: Home / Self Care | Payer: No Typology Code available for payment source | Attending: Obstetrics and Gynecology | Admitting: Obstetrics and Gynecology

## 2018-01-28 DIAGNOSIS — Z3A38 38 weeks gestation of pregnancy: Secondary | ICD-10-CM

## 2018-01-28 DIAGNOSIS — Z3483 Encounter for supervision of other normal pregnancy, third trimester: Secondary | ICD-10-CM | POA: Diagnosis present

## 2018-01-28 LAB — TYPE AND SCREEN
ABO/RH(D): O POS
Antibody Screen: NEGATIVE

## 2018-01-28 LAB — CBC
HCT: 40.8 % (ref 36.0–46.0)
Hemoglobin: 14 g/dL (ref 12.0–15.0)
MCH: 30.1 pg (ref 26.0–34.0)
MCHC: 34.3 g/dL (ref 30.0–36.0)
MCV: 87.7 fL (ref 78.0–100.0)
PLATELETS: 204 10*3/uL (ref 150–400)
RBC: 4.65 MIL/uL (ref 3.87–5.11)
RDW: 14 % (ref 11.5–15.5)
WBC: 14.3 10*3/uL — ABNORMAL HIGH (ref 4.0–10.5)

## 2018-01-28 LAB — ABO/RH: ABO/RH(D): O POS

## 2018-01-28 MED ORDER — ONDANSETRON HCL 4 MG/2ML IJ SOLN: 4.0000 mg | Freq: Four times a day (QID) | INTRAMUSCULAR | Status: DC | PRN

## 2018-01-28 MED ORDER — FENTANYL 2.5 MCG/ML BUPIVACAINE 1/10 % EPIDURAL INFUSION (WH - ANES)
INTRAMUSCULAR | Status: AC
  Filled 2018-01-28: qty 100

## 2018-01-28 MED ORDER — METHYLERGONOVINE MALEATE 0.2 MG PO TABS: 0.2000 mg | ORAL_TABLET | ORAL | Status: DC | PRN

## 2018-01-28 MED ORDER — WITCH HAZEL-GLYCERIN EX PADS: 1.0000 "application " | MEDICATED_PAD | CUTANEOUS | Status: DC | PRN

## 2018-01-28 MED ORDER — OXYTOCIN BOLUS FROM INFUSION
500.0000 mL | Freq: Once | INTRAVENOUS | Status: AC
  Administered 2018-01-28: 500 mL via INTRAVENOUS

## 2018-01-28 MED ORDER — FLEET ENEMA 7-19 GM/118ML RE ENEM: 1.0000 | ENEMA | RECTAL | Status: DC | PRN

## 2018-01-28 MED ORDER — FENTANYL 2.5 MCG/ML BUPIVACAINE 1/10 % EPIDURAL INFUSION (WH - ANES)
14.0000 mL/h | INTRAMUSCULAR | Status: DC | PRN
  Administered 2018-01-28: 14 mL/h via EPIDURAL

## 2018-01-28 MED ORDER — OXYCODONE-ACETAMINOPHEN 5-325 MG PO TABS: 2.0000 | ORAL_TABLET | ORAL | Status: DC | PRN

## 2018-01-28 MED ORDER — ONDANSETRON HCL 4 MG PO TABS: 4.0000 mg | ORAL_TABLET | ORAL | Status: DC | PRN

## 2018-01-28 MED ORDER — ONDANSETRON HCL 4 MG/2ML IJ SOLN: 4.0000 mg | INTRAMUSCULAR | Status: DC | PRN

## 2018-01-28 MED ORDER — OXYTOCIN 40 UNITS IN LACTATED RINGERS INFUSION - SIMPLE MED
1.0000 m[IU]/min | INTRAVENOUS | Status: DC
  Administered 2018-01-28: 2 m[IU]/min via INTRAVENOUS

## 2018-01-28 MED ORDER — SENNOSIDES-DOCUSATE SODIUM 8.6-50 MG PO TABS
2.0000 | ORAL_TABLET | ORAL | Status: DC
  Administered 2018-01-29: 2 via ORAL
  Filled 2018-01-28 (×2): qty 2

## 2018-01-28 MED ORDER — PRENATAL MULTIVITAMIN CH
1.0000 | ORAL_TABLET | Freq: Every day | ORAL | Status: DC
  Administered 2018-01-30: 1 via ORAL
  Filled 2018-01-28 (×2): qty 1

## 2018-01-28 MED ORDER — LACTATED RINGERS IV SOLN
INTRAVENOUS | Status: DC
  Administered 2018-01-28: 12:00:00 via INTRAVENOUS

## 2018-01-28 MED ORDER — TETANUS-DIPHTH-ACELL PERTUSSIS 5-2.5-18.5 LF-MCG/0.5 IM SUSP: 0.5000 mL | Freq: Once | INTRAMUSCULAR | Status: DC

## 2018-01-28 MED ORDER — EPHEDRINE 5 MG/ML INJ
10.0000 mg | INTRAVENOUS | Status: DC | PRN
  Filled 2018-01-28: qty 2

## 2018-01-28 MED ORDER — LACTATED RINGERS IV SOLN: 500.0000 mL | Freq: Once | INTRAVENOUS | Status: DC

## 2018-01-28 MED ORDER — DIPHENHYDRAMINE HCL 25 MG PO CAPS: 25.0000 mg | ORAL_CAPSULE | Freq: Four times a day (QID) | ORAL | Status: DC | PRN

## 2018-01-28 MED ORDER — TERBUTALINE SULFATE 1 MG/ML IJ SOLN
0.2500 mg | Freq: Once | INTRAMUSCULAR | Status: DC | PRN
  Filled 2018-01-28: qty 1

## 2018-01-28 MED ORDER — ZOLPIDEM TARTRATE 5 MG PO TABS: 5.0000 mg | ORAL_TABLET | Freq: Every evening | ORAL | Status: DC | PRN

## 2018-01-28 MED ORDER — SIMETHICONE 80 MG PO CHEW: 80.0000 mg | CHEWABLE_TABLET | ORAL | Status: DC | PRN

## 2018-01-28 MED ORDER — PHENYLEPHRINE 40 MCG/ML (10ML) SYRINGE FOR IV PUSH (FOR BLOOD PRESSURE SUPPORT)
PREFILLED_SYRINGE | INTRAVENOUS | Status: AC
  Filled 2018-01-28: qty 10

## 2018-01-28 MED ORDER — PHENYLEPHRINE 40 MCG/ML (10ML) SYRINGE FOR IV PUSH (FOR BLOOD PRESSURE SUPPORT)
80.0000 ug | PREFILLED_SYRINGE | INTRAVENOUS | Status: DC | PRN
  Filled 2018-01-28: qty 5

## 2018-01-28 MED ORDER — DIPHENHYDRAMINE HCL 50 MG/ML IJ SOLN: 12.5000 mg | INTRAMUSCULAR | Status: DC | PRN

## 2018-01-28 MED ORDER — BENZOCAINE-MENTHOL 20-0.5 % EX AERO
1.0000 "application " | INHALATION_SPRAY | CUTANEOUS | Status: DC | PRN
  Administered 2018-01-30: 1 via TOPICAL
  Filled 2018-01-28: qty 56

## 2018-01-28 MED ORDER — METHYLERGONOVINE MALEATE 0.2 MG/ML IJ SOLN: 0.2000 mg | INTRAMUSCULAR | Status: DC | PRN

## 2018-01-28 MED ORDER — ACETAMINOPHEN 325 MG PO TABS: 650.0000 mg | ORAL_TABLET | ORAL | Status: DC | PRN

## 2018-01-28 MED ORDER — OXYCODONE-ACETAMINOPHEN 5-325 MG PO TABS: 1.0000 | ORAL_TABLET | ORAL | Status: DC | PRN

## 2018-01-28 MED ORDER — COCONUT OIL OIL: 1.0000 "application " | TOPICAL_OIL | Status: DC | PRN

## 2018-01-28 MED ORDER — OXYTOCIN 40 UNITS IN LACTATED RINGERS INFUSION - SIMPLE MED
2.5000 [IU]/h | INTRAVENOUS | Status: DC
  Filled 2018-01-28: qty 1000

## 2018-01-28 MED ORDER — DIBUCAINE 1 % RE OINT: 1.0000 "application " | TOPICAL_OINTMENT | RECTAL | Status: DC | PRN

## 2018-01-28 MED ORDER — LIDOCAINE HCL (PF) 1 % IJ SOLN
30.0000 mL | INTRAMUSCULAR | Status: DC | PRN
  Filled 2018-01-28: qty 30

## 2018-01-28 MED ORDER — LACTATED RINGERS IV SOLN: 500.0000 mL | INTRAVENOUS | Status: DC | PRN

## 2018-01-28 MED ORDER — IBUPROFEN 600 MG PO TABS
600.0000 mg | ORAL_TABLET | Freq: Four times a day (QID) | ORAL | Status: DC
  Administered 2018-01-29 – 2018-01-30 (×7): 600 mg via ORAL
  Filled 2018-01-28 (×7): qty 1

## 2018-01-28 MED ORDER — LIDOCAINE HCL (PF) 1 % IJ SOLN
INTRAMUSCULAR | Status: DC | PRN
  Administered 2018-01-28: 13 mL via EPIDURAL

## 2018-01-28 MED ORDER — SOD CITRATE-CITRIC ACID 500-334 MG/5ML PO SOLN: 30.0000 mL | ORAL | Status: DC | PRN

## 2018-01-28 NOTE — MAU Note (Signed)
Pt presents with c/o ctxs every 6 minutes apart.  Denies LOF or VB.  Reports lost mucous plug yesterday.  Reports +FM.

## 2018-01-28 NOTE — Anesthesia Procedure Notes (Signed)
Epidural Patient location during procedure: OB Start time: 01/28/2018 12:53 PM End time: 01/28/2018 1:08 PM  Staffing Anesthesiologist: Lowella Curb, MD Performed: anesthesiologist   Preanesthetic Checklist Completed: patient identified, site marked, surgical consent, pre-op evaluation, timeout performed, IV checked, risks and benefits discussed and monitors and equipment checked  Epidural Patient position: sitting Prep: ChloraPrep Patient monitoring: heart rate, cardiac monitor, continuous pulse ox and blood pressure Approach: midline Location: L2-L3 Injection technique: LOR saline  Needle:  Needle type: Tuohy  Needle gauge: 17 G Needle length: 9 cm Needle insertion depth: 6 cm Catheter type: closed end flexible Catheter size: 20 Guage Catheter at skin depth: 10 cm Test dose: negative  Assessment Events: blood not aspirated, injection not painful, no injection resistance, negative IV test and no paresthesia  Additional Notes Reason for block:procedure for pain

## 2018-01-28 NOTE — Anesthesia Preprocedure Evaluation (Signed)

## 2018-01-28 NOTE — Anesthesia Pain Management Evaluation Note (Signed)
  CRNA Pain Management Visit Note  Patient: Crystal Dominguez, 32 y.o., female  "Hello I am a member of the anesthesia team at [facility]. We have an anesthesia team available at all times to provide care throughout the hospital, including epidural management and anesthesia for C-section. I don't know your plan for the delivery whether it a natural birth, water birth, IV sedation, nitrous supplementation, doula or epidural, but we want to meet your pain goals."   1.Was your pain managed to your expectations on prior hospitalizations?   No prior hospitalizations  2.What is your expectation for pain management during this hospitalization?     Epidural  3.How can we help you reach that goal? epidural  Record the patient's initial score and the patient's pain goal.   Pain: 0  Pain Goal: 4 [facility] wants you to be able to say your pain was always managed very well.  Saraiah Bhat 01/28/2018

## 2018-01-28 NOTE — H&P (Signed)
Crystal Dominguez is a 32 y.o. female presenting for labor  32 yo G3P0020 @ 38+6 presents to MAU in labor. Her pregnancy has been otherwise uncomplicated  OB History    Gravida  3   Para  0   Term  0   Preterm  0   AB  2   Living  0     SAB  1   TAB  1   Ectopic  0   Multiple  0   Live Births  0          Past Medical History:  Diagnosis Date  . Medical history non-contributory    Past Surgical History:  Procedure Laterality Date  . DILATION AND CURETTAGE OF UTERUS    . THERAPEUTIC ABORTION     Family History: family history includes Epilepsy in her father. Social History:  reports that she has never smoked. She has never used smokeless tobacco. She reports that she drank about 3.0 standard drinks of alcohol per week. She reports that she does not use drugs.     Maternal Diabetes: No Genetic Screening: Normal Maternal Ultrasounds/Referrals: Normal Fetal Ultrasounds or other Referrals:  None Maternal Substance Abuse:  No Significant Maternal Medications:  None Significant Maternal Lab Results:  None Other Comments:  None  ROS History Dilation: 10 Effacement (%): 100 Station: Plus 1 Exam by:: Dr. Tenny Craw Blood pressure 139/82, pulse 78, temperature 98.1 F (36.7 C), temperature source Oral, resp. rate 18, height 5' (1.524 m), weight 88.6 kg, last menstrual period 03/21/2017, SpO2 100 %. Exam Physical Exam  Prenatal labs: ABO, Rh: --/--/O POS (09/04 1155) Antibody: NEG (09/04 1155) Rubella: Immune (02/21 0000) RPR:   NR HBsAg: Negative (02/21 0000)  HIV: Non-reactive (02/21 0000)  GBS: Negative (08/08 0000)   Assessment/Plan: 1) Admit 2) Epidural on request 3) Anticipate svd   Waynard Reeds 01/28/2018, 5:56 PM

## 2018-01-29 LAB — CBC
HEMATOCRIT: 35.7 % — AB (ref 36.0–46.0)
Hemoglobin: 12.2 g/dL (ref 12.0–15.0)
MCH: 30.1 pg (ref 26.0–34.0)
MCHC: 34.2 g/dL (ref 30.0–36.0)
MCV: 88.1 fL (ref 78.0–100.0)
PLATELETS: 196 10*3/uL (ref 150–400)
RBC: 4.05 MIL/uL (ref 3.87–5.11)
RDW: 14.4 % (ref 11.5–15.5)
WBC: 15.9 10*3/uL — AB (ref 4.0–10.5)

## 2018-01-29 LAB — RPR: RPR: NONREACTIVE

## 2018-01-29 NOTE — Lactation Note (Signed)
This note was copied from a baby's chart. Lactation Consultation Note  Patient Name: Crystal Dominguez [id number] Date: 01/29/2018 Reason for consult: Initial assessment;1st time breastfeeding;Early term 37-38.6wks;Infant < 6lbs;Infant weight loss  P1 mother whose infant is now 50 hours old.  This is an ETI at 38+6 weeks weighing 4+10 lbs  Mother was holding baby STS when I arrived.  I had a discussion with mother regarding baby's gestational age and weight and the importance of making sure the baby is feeding.  Mother is very adamant about not wanting to supplement with formula.  I presented the case scenario about what may possibly happen if baby does not feed.  Mother verbalized understanding.  I came up with a strict plan for this baby with mother's approval.  This plan includes awakening baby every 3 hours if she does not self awaken and put her to breast.  Discussed ways to stimulate baby and how to keep her awake at the breast during feeds.  Immediately after feeding mother will pump with the DEBP and feed back any EBM she may obtain with pumping and hand expression.  Encouraged mother to continue doing a lot of STS, breast massage and hand expression before and after feedings.  I did mention that, at some point, there may be no option but to supplement.  We are going to do as much as possible to prevent supplementation per mother's request.  Mother seems very willing to cooperate to get her baby to feed whether this be breast feeding with supplemented EBM or pumped EBM.    Mom made aware of O/P services, breastfeeding support groups, community resources, and our phone # for post-discharge questions. Mother has good family support, especially her sister who set up the DEBP for her last evening.  Mother will call for latch assistance as needed.  RN updated.   Maternal Data Formula Feeding for Exclusion: No Has patient been taught Hand Expression?: Yes Does the patient have breastfeeding  experience prior to this delivery?: No  Feeding Feeding Type: Breast Fed Length of feed: 15 min  LATCH Score Latch: Too sleepy or reluctant, no latch achieved, no sucking elicited.  Audible Swallowing: None  Type of Nipple: Everted at rest and after stimulation  Comfort (Breast/Nipple): Soft / non-tender  Hold (Positioning): Assistance needed to correctly position infant at breast and maintain latch.  LATCH Score: 5  Interventions    Lactation Tools Discussed/Used WIC Program: No   Consult Status Consult Status: Follow-up Date: 01/30/18 Follow-up type: In-patient    Labrian Torregrossa R Lynnett Langlinais 01/29/2018, 11:41 AM

## 2018-01-29 NOTE — Lactation Note (Signed)
This note was copied from a baby's chart. Lactation Consultation Note Baby 28 hrs old. Baby wt. Less than 5 lbs.  RN notified LC mom was getting over whelmed supplementing w/curve tip syring. Spoke w/mom, mom stated she was starting to figure it out. Gave mom LPI information sheet. Discussed w/mom d/t baby being less than 5 lbs baby needs amount of supplementing according to LPI amounts w/age. Reviewed not to feed longer than 30 min.  Mom preferred not to use formula but is only using 2 to 3 ml formula. Giving 5 to 7 colostrum. Mom is pumping. Mom has hands free bra, encouraged to massage breast while pumping and hand expressing after finished. Discussed at 48 hrs old baby needs 10-20 ml supplementation. Amount increases w/hours of age. Gave mom 5 fr tubing and syring. Gave paper tape instructed mom on how to use and placing  Baby in upright position w/support.  Discussed how to clean syring and tubing as well. Mom wanted to know why she wasn't giving this when she delivered. Explained to mom that this was an option of a way to supplement since amount has increased. Mom confirmed she didn't want to use a bottle. Explained to mom there are different ways to supplement. Lactation has more options than can be tried as well, but for now try this.  Praised mom for pumping, encouraged to relax as pumping, and BF. Reviewed using DEBP and preemie setting. Mom stated wasn't aware. Explained normal to to feel overwhelmed, tired, and worried. Praised mom for amount of colostrum collected. Reviewed mature milk coming in 3-5 days.  Encouraged mom to give colostrum first and separate from formula. Reminded mom all of these things are temporary until mom's milk comes in and baby gets bigger.  Mom was smiling at the end of consult and thanked LC. Encouraged to call for assistance or questions.  Reported ot RN of consult information.  Patient Name: Girl Davis Rosebush [id number] Date: 01/29/2018 Reason for consult:  Follow-up assessment;Infant weight loss;Infant < 6lbs   Maternal Data    Feeding Feeding Type: Breast Milk  LATCH Score                   Interventions    Lactation Tools Discussed/Used Pump Review: Setup, frequency, and cleaning;Milk Storage Initiated by:: RN Date initiated:: 01/29/18   Consult Status Consult Status: Follow-up Date: 01/30/18 Follow-up type: In-patient    Charyl Dancer 01/29/2018, 10:21 PM

## 2018-01-29 NOTE — Progress Notes (Signed)
Patient is eating, ambulating, voiding.  Pain control is good.  Vitals:   01/28/18 1942 01/28/18 2040 01/29/18 0040 01/29/18 0440  BP: 127/81 131/77 116/75 107/66  Pulse: 97 (!) 110 (!) 115 (!) 105  Resp: 18 18 18 18   Temp: 98.2 F (36.8 C) 98.3 F (36.8 C) 99.4 F (37.4 C) 97.7 F (36.5 C)  TempSrc: Oral Oral Oral Oral  SpO2:      Weight:      Height:        Fundus firm Perineum without swelling.  Lab Results  Component Value Date   WBC 15.9 (H) 01/29/2018   HGB 12.2 01/29/2018   HCT 35.7 (L) 01/29/2018   MCV 88.1 01/29/2018   PLT 196 01/29/2018    --/--/O POS, O POS Performed at [facility], [address], [place], [place] [address]  (09/04 1155)/RI  A/P Post partum day 1.  Routine care.  Expect d/c tomorrow.    Anis Cinelli A

## 2018-01-29 NOTE — Anesthesia Postprocedure Evaluation (Signed)
Anesthesia Post Note  Patient: Crystal Dominguez  Procedure(s) Performed: AN AD HOC LABOR EPIDURAL     Patient location during evaluation: Mother Baby Anesthesia Type: Epidural Level of consciousness: awake and alert and oriented Pain management: satisfactory to patient Vital Signs Assessment: post-procedure vital signs reviewed and stable Respiratory status: respiratory function stable Cardiovascular status: stable Postop Assessment: no headache, no backache, epidural receding, patient able to bend at knees, no signs of nausea or vomiting and adequate PO intake Anesthetic complications: no    Last Vitals:  Vitals:   01/29/18 0040 01/29/18 0440  BP: 116/75 107/66  Pulse: (!) 115 (!) 105  Resp: 18 18  Temp: 37.4 C 36.5 C  SpO2:      Last Pain:  Vitals:   01/29/18 0440  TempSrc: Oral  PainSc: 0-No pain   Pain Goal:                 [name]

## 2018-01-30 MED ORDER — IBUPROFEN 600 MG PO TABS: 600.0000 mg | ORAL_TABLET | Freq: Four times a day (QID) | ORAL | 0 refills | Status: AC

## 2018-01-30 NOTE — Discharge Summary (Signed)
Obstetric Discharge Summary Reason for Admission: onset of labor Prenatal Procedures: ultrasound Intrapartum Procedures: spontaneous vaginal delivery Postpartum Procedures: none Complications-Operative and Postpartum: 1st degree perineal laceration Hemoglobin  Date Value Ref Range Status  01/29/2018 12.2 12.0 - 15.0 g/dL Final  35/59/7416 CANCELED      Comment:    Test not performed  Result canceled by the ancillary    HCT  Date Value Ref Range Status  01/29/2018 35.7 (L) 36.0 - 46.0 % Final   Hematocrit  Date Value Ref Range Status  12/18/2016 CANCELED      Comment:    Test not performed  Result canceled by the ancillary     Physical Exam:  General: alert and cooperative Lochia: appropriate Uterine Fundus: firm   Discharge Diagnoses: Term Pregnancy-delivered  Discharge Information: Date: 01/30/2018 Activity: pelvic rest Diet: routine Medications: PNV and Ibuprofen Condition: stable Instructions: refer to practice specific booklet Discharge to: home Follow-up Information    Waynard Reeds, MD Follow up.   Specialty:  Obstetrics and Gynecology Contact information: [address] [place] [address] [phone]           Newborn Data: Live born female  Birth Weight: 4 lb 10.3 oz (2106 g) APGAR: 2, 8  Newborn Delivery   Birth date/time:  01/28/2018 17:36:00 Delivery type:  Vaginal, Spontaneous     Home with mother.  ANDERSON,MARK E 01/30/2018, 8:57 AM

## 2018-01-30 NOTE — Lactation Note (Addendum)
This note was copied from a baby's chart. Lactation Consultation Note  Patient Name: Crystal Dominguez [id number] Date: 01/30/2018 Reason for consult: Follow-up assessment;Infant weight loss;1st time breastfeeding;Infant < 6lbs;Early term 37-38.6wks;Other (Comment)(SGA) Mom Breastfeeding on arrival.  Infant in football.  Head in towards chest, smacking at the breast.  Reminded mom she had to have her head tilted back slightly so she could have enough room to swallow.Repositioned infant and she comes off and on. When infant came off moms nipple misshapen.  Looked like tube of lipstick. Infant started feeding and after a few sucks let the breast go and would not go back on.  Still doing some cuing and sucking on her hands.   Mom reports pumping past breastfeeds.  Pumped 10 ml at last pump session. Mom has been feeding with finger and 5 french tube.  Mom asked if she could just put tube in her mouth.  Reviewed feeding methods with mom.  Mom also has similac slow flow bottle nipples.  Discussed paced bottle feeding and showed parents how to do that.  Infant did not do well with bottle.  Making grimace during feed and not sucking much so mom switched back to syringe and finger feeding.  Reminded parents this was temporary.  Encouraged mom to breastfeed first pump past breastfeedings and feed back all EBM after feeds in whatever method infant would take and she was comfortable with.  Reviewed appropriate supplement amount for infant past breastfeeding. Urged mom to follow up with formula past breastfeedings and offering EBM if she did not have enough breastmilk to supplement her with.  Urged mom to continue with this feeding plan until the see pediatrician.  Reminded parents that is was temporary. And that Aubree would grow and not have to be supplemented past breastfeeds.  Mom asked about quitting supplements when her milk came in.  Urged her not to quite supplements until mom sees pediatrician and they make a plan.   Mom reports he breasts are starting to be bigger and heavy. Praised Breastfeeding efforts.  Parents reports they feel confident going home .  Mom aware of outpatient lactation services. Mom also has Medela breastpump at home for home use.  Maternal Data    Feeding Feeding Type: Breast Fed Length of feed: 10 min  LATCH Score Latch: Repeated attempts needed to sustain latch, nipple held in mouth throughout feeding, stimulation needed to elicit sucking reflex.  Audible Swallowing: A few with stimulation  Type of Nipple: Everted at rest and after stimulation  Comfort (Breast/Nipple): Soft / non-tender  Hold (Positioning): Assistance needed to correctly position infant at breast and maintain latch.  LATCH Score: 7  Interventions Interventions: Support pillows;DEBP;Expressed milk;Assisted with latch  Lactation Tools Discussed/Used     Consult Status Consult Status: Complete Date: 01/30/18 Follow-up type: Call as needed    [facility] 01/30/2018, 11:18 AM

## 2018-01-30 NOTE — Progress Notes (Signed)
PPD#2  Pt doing well. She would like to go home. Lochia light. VSSAF IMP/ Doing well PP Plan/ Will discharge

## 2018-03-05 ENCOUNTER — Other Ambulatory Visit: Payer: Self-pay | Admitting: Obstetrics and Gynecology

## 2018-03-05 DIAGNOSIS — R222 Localized swelling, mass and lump, trunk: Secondary | ICD-10-CM

## 2018-03-13 ENCOUNTER — Other Ambulatory Visit: Payer: No Typology Code available for payment source

## 2018-03-24 ENCOUNTER — Ambulatory Visit
Admission: RE | Admit: 2018-03-24 | Discharge: 2018-03-24 | Disposition: A | Discharge status: Home / Self Care | Payer: No Typology Code available for payment source | Source: Ambulatory Visit | Attending: Obstetrics and Gynecology | Admitting: Obstetrics and Gynecology

## 2018-03-24 ENCOUNTER — Other Ambulatory Visit: Payer: Self-pay | Admitting: Obstetrics and Gynecology

## 2018-03-24 DIAGNOSIS — R222 Localized swelling, mass and lump, trunk: Secondary | ICD-10-CM

## 2018-04-09 ENCOUNTER — Ambulatory Visit
Admission: RE | Admit: 2018-04-09 | Discharge: 2018-04-09 | Disposition: A | Discharge status: Home / Self Care | Payer: No Typology Code available for payment source | Source: Ambulatory Visit | Attending: Obstetrics and Gynecology | Admitting: Obstetrics and Gynecology

## 2018-04-09 DIAGNOSIS — R222 Localized swelling, mass and lump, trunk: Secondary | ICD-10-CM

## 2018-06-09 ENCOUNTER — Other Ambulatory Visit: Payer: Self-pay | Admitting: Obstetrics

## 2018-06-09 DIAGNOSIS — N6310 Unspecified lump in the right breast, unspecified quadrant: Secondary | ICD-10-CM

## 2018-08-07 ENCOUNTER — Ambulatory Visit: Payer: Self-pay | Admitting: Surgery

## 2018-08-07 NOTE — H&P (Signed)
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  This popped up in about October of last year.  She has been treated with innumerable courses of antibiotics.  Has not had any prior procedures and the lesion has been draining spontaneously, malodorous purulent fluid.  She never had any issues or lesions prior to the onset of this and denies any skin infections elsewhere.   Seen in urgent office 07/31/18: "The patient is a 33 year old female who presents with a complaint of Hidradenitis. She is presenting per request of [facility], who evaluated her on 07/10/18 for a draining area in her right axilla, which had been causing her issues since November 2019. She states a few month before she noticed any drainage, there was a lump in the area that would come and go. However, it became infected in October and she saw her OB/GYN. She was given antibiotics and referral to the Breast center. First [place] was on 03/24/18 and showed 2 large fluid collections. She was given Keflex. Repeat ultrasound 2 weeks later, showed decreased fluid collection and resolution of symptoms. She was seen by her PCP again and referred to a dermatologist who also gave her antibiotics.   She denies pain currently but states at one point, she was unable to lift her arm without pain. States the foul-smelling drainage began again about 4 days ago. Denies fevers. Denies much pain. She is frustrated because she feels like no one is giving her a solution to this issue, other than medications. She does not smoke. She is breast feeding"  Allergies  Allergen Reactions  . Shellfish-Derived Products     Crabmeat    Past Medical History:  Diagnosis Date  . Medical history non-contributory     Past Surgical History:  Procedure Laterality Date  . DILATION AND CURETTAGE OF UTERUS     . THERAPEUTIC ABORTION      Family History  Problem Relation Age of Onset  . Epilepsy Father     Social History   Socioeconomic History  . Marital status: Single    Spouse name: Not on file  . Number of children: 0  . Years of education: Not on file  . Highest education level: Not on file  Occupational History  . Not on file  Social Needs  . Financial resource strain: Not on file  . Food insecurity:    Worry: Not on file    Inability: Not on file  . Transportation needs:    Medical: Not on file    Non-medical: Not on file  Tobacco Use  . Smoking status: Never Smoker  . Smokeless tobacco: Never Used  Substance and Sexual Activity  . Alcohol use: Not Currently    Alcohol/week: 3.0 standard drinks    Types: 3 Glasses of wine per week  . Drug use: No  . Sexual activity: Yes    Partners: Male    Birth control/protection: None  Lifestyle  . Physical activity:    Days per week: Not on file    Minutes per session: Not on file  . Stress: Not on file  Relationships  . Social connections:    Talks on phone: Not on file    Gets together: Not on file    Attends religious service: Not on file    Active member of club or organization: Not  on file    Attends meetings of clubs or organizations: Not on file    Relationship status: Not on file  Other Topics Concern  . Not on file  Social History Narrative   She is from [place]. 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 She is currently in a relationship. She is sexually active with monogamous partner.     Current Outpatient Medications on File Prior to Visit  Medication Sig Dispense Refill  . fluticasone (FLONASE) 50 MCG/ACT nasal spray Place 1 spray into both nostrils 2 (two) times daily. (Patient not taking: Reported on 01/28/2018) 16 g 0  . ibuprofen (ADVIL,MOTRIN) 600 MG tablet Take 1 tablet (600 mg total) by mouth every 6 (six) hours. 30 tablet 0  . Prenatal Vit-Fe Fumarate-FA (PRENATAL MULTIVITAMIN)  TABS tablet Take 1 tablet by mouth daily at 12 noon.     No current facility-administered medications on file prior to visit.     Review of Systems: a complete, 10pt review of systems was completed with pertinent positives and negatives as documented in the HPI  Physical Exam: There were no vitals filed for this visit. Gen: alert and well appearing Eye: extraocular motion intact, no scleral icterus ENT: moist mucus membranes, dentition intact Neck: no mass or thyromegaly Chest: unlabored respirations, symmetrical air entry, clear bilaterally CV: regular rate and rhythm, no pedal edema Abdomen: soft, nontender, nondistended. No mass or organomegaly MSK: strength symmetrical throughout, no deformity Neuro: grossly intact, normal gait Psych: normal mood and affect, appropriate insight Skin: warm and dry.  In the right axilla there is a chronic subcutaneous sinus draining purulent fluid, parallel to the axillary crease and approximately 3 x 2 cm in dimensions   CBC Latest Ref Rng & Units 01/29/2018 01/28/2018 12/18/2016  WBC 4.0 - 10.5 K/uL 15.9(H) 14.3(H) CANCELED  Hemoglobin 12.0 - 15.0 g/dL 12.1 62.4 CANCELED  Hematocrit 36.0 - 46.0 % 35.7(L) 40.8 CANCELED  Platelets 150 - 400 K/uL 196 204 CANCELED    CMP Latest Ref Rng & Units 12/18/2016 12/18/2016  Glucose - CANCELED 92  BUN - CANCELED 9  Creatinine - CANCELED 0.61  Sodium - CANCELED 138  Potassium mmol/L CANCELED 4.3  Chloride - CANCELED 101  CO2 - CANCELED 24  Calcium mg/dL CANCELED 9.3  Total Protein g/dL CANCELED 6.7  Total Bilirubin - CANCELED 0.3  Alkaline Phos - CANCELED 49  AST - CANCELED 19  ALT - CANCELED 13    No results found for: INR, PROTIME  Imaging: No results found.   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 It is a fairly localized lesion. Discussed local excision with risks of bleeding, infection, pain, scarring, wound dehiscence and need  to care for indwelling for several weeks, discussed risk of recurrent hidradenitis in other locations in the axilla or other parts of the body. Questions were welcomed and answered to her satisfaction. We'll proceed with scheduling   Phylliss Blakes, MD [facility], [place] Pager [phone]

## 2018-08-12 ENCOUNTER — Other Ambulatory Visit: Payer: Self-pay

## 2018-08-12 ENCOUNTER — Encounter (HOSPITAL_BASED_OUTPATIENT_CLINIC_OR_DEPARTMENT_OTHER): Payer: Self-pay

## 2018-08-12 NOTE — Progress Notes (Signed)
Spoke with: Magali NPO:  After Midnight, no gum, candy, or mints   Arrival time: 0900AM Labs: N/A AM medications: None Pre op orders: Yes Ride home: Magda Paganini (boyfriend) [phone] Instructed on Hibiclens showers

## 2018-08-18 ENCOUNTER — Ambulatory Visit (HOSPITAL_BASED_OUTPATIENT_CLINIC_OR_DEPARTMENT_OTHER)
Admission: RE | Admit: 2018-08-18 | Payer: No Typology Code available for payment source | Source: Home / Self Care | Admitting: Surgery

## 2018-08-18 HISTORY — DX: Hidradenitis suppurativa: L73.2

## 2018-08-18 SURGERY — EXCISION, HIDRADENITIS, AXILLA
Anesthesia: Monitor Anesthesia Care | Laterality: Right

## 2018-10-07 NOTE — Pre-Procedure Instructions (Signed)
Crystal Dominguez  10/07/2018      [id number] [place], [place] - [address] [facility] [address] [place] [id number] Phone: [phone] Fax: [phone]    Your procedure is scheduled on Tues., Oct 13, 2018 from 7:30AM-8:30AM  [facility]" at 5:30AM  Call this number if you have problems the morning of surgery:  [phone]   Remember:  Do not eat or drink after midnight on May 18th    Take these medicines the morning of surgery with A SIP OF WATER: Norgestim-Eth Estrad Triphasic (ORTHO TRI-CYCLEN LO PO)  As of today, stop taking all Other Aspirin Products, Vitamins, Fish oils, and Herbal medications. Also stop all NSAIDS i.e. Advil, Ibuprofen, Motrin, Aleve, Anaprox, Naproxen, BC, Goody Powders, and all Supplements.    Special instructions:  [place]- Preparing For Surgery  Before surgery, you can play an important role. Because skin is not sterile, your skin needs to be as free of germs as possible. You can reduce the number of germs on your skin by washing with CHG (chlorahexidine gluconate) Soap before surgery.  CHG is an antiseptic cleaner which kills germs and bonds with the skin to continue killing germs even after washing.    Please do not use if you have an allergy to CHG or antibacterial soaps. If your skin becomes reddened/irritated stop using the CHG.  Do not shave (including legs and underarms) for at least 48 hours prior to first CHG shower. It is OK to shave your face.  Please follow these instructions carefully.   1. Shower the NIGHT BEFORE SURGERY and the MORNING OF SURGERY with CHG.   2. If you chose to wash your hair, wash your hair first as usual with your normal shampoo.  3. After you shampoo, rinse your hair and body thoroughly to remove the shampoo.  4. Use CHG as you would any other liquid soap. You can apply CHG directly to the skin and wash gently  with a scrungie or a clean washcloth.   5. Apply the CHG Soap to your body ONLY FROM THE NECK DOWN.  Do not use on open wounds or open sores. Avoid contact with your eyes, ears, mouth and genitals (private parts). Wash Face and genitals (private parts)  with your normal soap.  6. Wash thoroughly, paying special attention to the area where your surgery will be performed.  7. Thoroughly rinse your body with warm water from the neck down.  8. DO NOT shower/wash with your normal soap after using and rinsing off the CHG Soap.  9. Pat yourself dry with a CLEAN TOWEL.  10. Wear CLEAN PAJAMAS to bed the night before surgery, wear comfortable clothes the morning of surgery  11. Place CLEAN SHEETS on your bed the night of your first shower and DO NOT SLEEP WITH PETS.   Day of Surgery:  Oral Hygiene is also important to reduce your risk of infection.  Remember - BRUSH YOUR TEETH THE MORNING OF SURGERY WITH YOUR REGULAR TOOTHPASTE   Do not wear jewelry, make-up or nail polish.  Do not wear lotions, powders, or perfumes, or deodorant.  Do not shave 48 hours prior to surgery.    Do not bring valuables to the hospital.  [facility] is not responsible for any belongings or valuables.  Contacts, dentures or bridgework may not be worn into surgery.   For patients  admitted to the hospital, discharge time will be determined by your treatment team.  Patients discharged the day of surgery will not be allowed to drive home.   Please wear clean clothes to the hospital/surgery center.     Please read over the following fact sheets that you were given. Pain Booklet, Coughing and Deep Breathing and Surgical Site Infection Prevention

## 2018-10-08 ENCOUNTER — Encounter (HOSPITAL_COMMUNITY): Payer: Self-pay

## 2018-10-08 ENCOUNTER — Other Ambulatory Visit: Payer: Self-pay

## 2018-10-08 ENCOUNTER — Encounter (HOSPITAL_COMMUNITY)
Admission: RE | Admit: 2018-10-08 | Discharge: 2018-10-08 | Disposition: A | Discharge status: Home / Self Care | Payer: No Typology Code available for payment source | Source: Ambulatory Visit | Attending: Surgery | Admitting: Surgery

## 2018-10-08 DIAGNOSIS — N6081 Other benign mammary dysplasias of right breast: Secondary | ICD-10-CM | POA: Diagnosis not present

## 2018-10-08 DIAGNOSIS — Z01818 Encounter for other preprocedural examination: Secondary | ICD-10-CM | POA: Diagnosis not present

## 2018-10-08 DIAGNOSIS — L732 Hidradenitis suppurativa: Secondary | ICD-10-CM | POA: Diagnosis present

## 2018-10-08 DIAGNOSIS — Z1159 Encounter for screening for other viral diseases: Secondary | ICD-10-CM | POA: Insufficient documentation

## 2018-10-08 LAB — CBC
HCT: 41.3 % (ref 36.0–46.0)
Hemoglobin: 13.2 g/dL (ref 12.0–15.0)
MCH: 29 pg (ref 26.0–34.0)
MCHC: 32 g/dL (ref 30.0–36.0)
MCV: 90.8 fL (ref 80.0–100.0)
Platelets: 241 10*3/uL (ref 150–400)
RBC: 4.55 MIL/uL (ref 3.87–5.11)
RDW: 13.2 % (ref 11.5–15.5)
WBC: 8 10*3/uL (ref 4.0–10.5)
nRBC: 0 % (ref 0.0–0.2)

## 2018-10-08 NOTE — Pre-Procedure Instructions (Signed)
Crystal Dominguez  10/08/2018    [facility]" at 5:30AM  Your surgery or procedure is scheduled for 7:30 AM  Call this number if you have problems the morning of surgery:[phone]  This is the number for the Pre- Surgical Desk. For any other questions, please call [phone], Monday - Friday 8 AM - 4 PM.  Remember:  Do not eat or drink after midnight on May 18th    Take these medicines the morning of surgery with A SIP OF WATER: Norgestim-Eth Estrad Triphasic (ORTHO TRI-CYCLEN LO PO)  As of today, stop taking all Other Aspirin Products, Vitamins, Fish oils, and Herbal medications. Also stop all NSAIDS i.e. Advil, Ibuprofen, Motrin, Aleve, Anaprox, Naproxen, BC, Goody Powders, and all Supplements.    Special instructions:  [place]- Preparing For Surgery    Before surgery, you can play an important role. Because skin is not sterile, your skin needs to be as free of germs as possible. You can reduce the number of germs on your skin by washing with CHG (chlorahexidine gluconate) Soap before surgery.  CHG is an antiseptic cleaner which kills germs and bonds with the skin to continue killing germs even after washing.    Please do not use if you have an allergy to CHG or antibacterial soaps. If your skin becomes reddened/irritated stop using the CHG.  Do not shave (including legs and underarms) for at least 48 hours prior to first CHG shower.   Please follow these instructions carefully.   1. Shower the NIGHT BEFORE SURGERY and the MORNING OF SURGERY with CHG.   2. If you chose to wash your hair, wash your hair first as usual with your normal shampoo.  3. After you shampoo, wash your face and private area with the soap you use at home, then rinse your hair and body thoroughly to remove the shampoo and soap.  4. Use CHG as you would any other liquid soap. You can apply CHG directly to the skin and wash gently with a scrungie or a clean washcloth.       Apply the CHG Soap to your body ONLY FROM THE NECK DOWN.  Do not use on open wounds or open sores. Avoid contact with your eyes, ears, mouth and genitals (private parts).  5. Wash thoroughly, paying special attention to the area where your surgery will be performed.  6. Thoroughly rinse your body with warm water from the neck down.  7. DO NOT shower/wash with your normal soap after using and rinsing off the CHG Soap.  8. Pat yourself dry with a CLEAN TOWEL.  9. Wear CLEAN PAJAMAS to bed the night before surgery, wear comfortable clothes the morning of surgery  10. Place CLEAN SHEETS on your bed the night of your first shower and DO NOT SLEEP WITH PETS.   Day of Surgery: Shower as stated Above  Oral Hygiene is also important to reduce your risk of infection.  Remember - BRUSH YOUR TEETH THE MORNING OF SURGERY WITH YOUR REGULAR TOOTHPASTE   Do not wear jewelry, make-up or nail polish.  Do not wear lotions, powders, or perfumes, or deodorant. Please wear clean clothes to the hospital/surgery center.     Do not bring valuables to the hospital.  [facility] is not responsible for any belongings or valuables.  Contacts, dentures or bridgework may not be worn into surgery.   For patients admitted to the hospital, discharge time will be determined by your  treatment team.  Patients discharged the day of surgery will not be allowed to drive home.   Please read over the following fact sheets that you were given. Pain Booklet, Coughing and Deep Breathing and Surgical Site Infection Prevention 10 Things YOU CAN DO TO [facility] YOUR HEALTH AT HOME

## 2018-10-08 NOTE — Progress Notes (Signed)
PCP - Benjiman Core, PA_C  Cardiologist - no  Chest x-ray - na  EKG - na  Stress Test - no  ECHO - no  Cardiac Cath - no  Sleep Study - no CPAP - no  LABS-CBC.  Urine Pregnancy DOS  ASA-no  ERAS-no orders  HA1C-na Fasting Blood Sugar - na Checks Blood Sugar ____0_ times a day  Anesthesia- Patient had question regarding breast feeding post op.  I spoke with Antionette Poles, PA-C, who said that medications giving IV during surgery will not interfere with breast feeding.  Fayrene Fearing recommended that patient check with her daughters Pediatrician if she is given Opioids for pain post op. Patient is scheduled for COVIA test Friday, at 0900. I spoke with patient about quarantine her self after test is done, and to not allow anyone that does not live with her to come in the house. Patient denies that she or her family has experienced any of the following: Cough Fever >100.4 Runny Nose Sore Throat Difficulty breathing/ shortness of breath Travel in past 14 days-no Pt denies having chest pain, sob, or fever at this time. All instructions explained to the pt, with a verbal understanding of the material. Pt agrees to go over the instructions while at home for a better understanding. The opportunity to ask questions was provided.

## 2018-10-09 ENCOUNTER — Other Ambulatory Visit (HOSPITAL_COMMUNITY)
Admission: RE | Admit: 2018-10-09 | Discharge: 2018-10-09 | Disposition: A | Discharge status: Home / Self Care | Payer: No Typology Code available for payment source | Source: Ambulatory Visit | Attending: Surgery | Admitting: Surgery

## 2018-10-09 DIAGNOSIS — Z01818 Encounter for other preprocedural examination: Secondary | ICD-10-CM | POA: Diagnosis not present

## 2018-10-10 LAB — NOVEL CORONAVIRUS, NAA (HOSP ORDER, SEND-OUT TO REF LAB; TAT 18-24 HRS): SARS-CoV-2, NAA: NOT DETECTED

## 2018-10-13 ENCOUNTER — Ambulatory Visit (HOSPITAL_COMMUNITY): Payer: No Typology Code available for payment source | Admitting: Anesthesiology

## 2018-10-13 ENCOUNTER — Ambulatory Visit (HOSPITAL_COMMUNITY): Payer: No Typology Code available for payment source | Admitting: Physician Assistant

## 2018-10-13 ENCOUNTER — Other Ambulatory Visit: Payer: Self-pay

## 2018-10-13 ENCOUNTER — Encounter (HOSPITAL_COMMUNITY)
Admission: RE | Disposition: A | Discharge status: Home / Self Care | Payer: Self-pay | Source: Home / Self Care | Attending: Surgery

## 2018-10-13 ENCOUNTER — Encounter (HOSPITAL_COMMUNITY): Payer: Self-pay | Admitting: *Deleted

## 2018-10-13 ENCOUNTER — Ambulatory Visit (HOSPITAL_COMMUNITY)
Admission: RE | Admit: 2018-10-13 | Discharge: 2018-10-13 | Disposition: A | Discharge status: Home / Self Care | Payer: No Typology Code available for payment source | Attending: Surgery | Admitting: Surgery

## 2018-10-13 DIAGNOSIS — L732 Hidradenitis suppurativa: Secondary | ICD-10-CM | POA: Diagnosis not present

## 2018-10-13 DIAGNOSIS — N6081 Other benign mammary dysplasias of right breast: Secondary | ICD-10-CM | POA: Insufficient documentation

## 2018-10-13 HISTORY — PX: HYDRADENITIS EXCISION: SHX5243

## 2018-10-13 LAB — POCT PREGNANCY, URINE: Preg Test, Ur: NEGATIVE

## 2018-10-13 SURGERY — EXCISION, HIDRADENITIS, AXILLA
Anesthesia: Monitor Anesthesia Care | Site: Axilla

## 2018-10-13 MED ORDER — LIDOCAINE 2% (20 MG/ML) 5 ML SYRINGE
INTRAMUSCULAR | Status: DC | PRN
  Administered 2018-10-13: 60 mg via INTRAVENOUS

## 2018-10-13 MED ORDER — SODIUM CHLORIDE 0.9% FLUSH: 3.0000 mL | INTRAVENOUS | Status: DC | PRN

## 2018-10-13 MED ORDER — DEXAMETHASONE SODIUM PHOSPHATE 10 MG/ML IJ SOLN
INTRAMUSCULAR | Status: AC
  Filled 2018-10-13: qty 1

## 2018-10-13 MED ORDER — ACETAMINOPHEN 325 MG PO TABS: 650.0000 mg | ORAL_TABLET | ORAL | Status: DC | PRN

## 2018-10-13 MED ORDER — BUPIVACAINE-EPINEPHRINE (PF) 0.25% -1:200000 IJ SOLN
INTRAMUSCULAR | Status: DC | PRN
  Administered 2018-10-13: 10 mL

## 2018-10-13 MED ORDER — OXYCODONE HCL 5 MG PO TABS: 5.0000 mg | ORAL_TABLET | Freq: Four times a day (QID) | ORAL | 0 refills | Status: AC | PRN

## 2018-10-13 MED ORDER — BUPIVACAINE-EPINEPHRINE (PF) 0.25% -1:200000 IJ SOLN
INTRAMUSCULAR | Status: AC
  Filled 2018-10-13: qty 30

## 2018-10-13 MED ORDER — MIDAZOLAM HCL 5 MG/5ML IJ SOLN
INTRAMUSCULAR | Status: DC | PRN
  Administered 2018-10-13: 2 mg via INTRAVENOUS

## 2018-10-13 MED ORDER — FENTANYL CITRATE (PF) 100 MCG/2ML IJ SOLN
INTRAMUSCULAR | Status: DC | PRN
  Administered 2018-10-13 (×3): 25 ug via INTRAVENOUS

## 2018-10-13 MED ORDER — GABAPENTIN 300 MG PO CAPS
300.0000 mg | ORAL_CAPSULE | ORAL | Status: AC
  Administered 2018-10-13: 300 mg via ORAL
  Filled 2018-10-13: qty 1

## 2018-10-13 MED ORDER — PROPOFOL 10 MG/ML IV BOLUS
INTRAVENOUS | Status: DC | PRN
  Administered 2018-10-13: 20 mg via INTRAVENOUS
  Administered 2018-10-13: 30 mg via INTRAVENOUS

## 2018-10-13 MED ORDER — FENTANYL CITRATE (PF) 100 MCG/2ML IJ SOLN: 25.0000 ug | INTRAMUSCULAR | Status: DC | PRN

## 2018-10-13 MED ORDER — CELECOXIB 200 MG PO CAPS
200.0000 mg | ORAL_CAPSULE | ORAL | Status: AC
  Administered 2018-10-13: 200 mg via ORAL
  Filled 2018-10-13: qty 1

## 2018-10-13 MED ORDER — ONDANSETRON HCL 4 MG/2ML IJ SOLN
INTRAMUSCULAR | Status: DC | PRN
  Administered 2018-10-13: 4 mg via INTRAVENOUS

## 2018-10-13 MED ORDER — DEXAMETHASONE SODIUM PHOSPHATE 10 MG/ML IJ SOLN
INTRAMUSCULAR | Status: DC | PRN
  Administered 2018-10-13: 5 mg via INTRAVENOUS

## 2018-10-13 MED ORDER — PROPOFOL 500 MG/50ML IV EMUL
INTRAVENOUS | Status: DC | PRN
  Administered 2018-10-13: 75 ug/kg/min via INTRAVENOUS

## 2018-10-13 MED ORDER — ACETAMINOPHEN 500 MG PO TABS
1000.0000 mg | ORAL_TABLET | ORAL | Status: AC
  Administered 2018-10-13: 1000 mg via ORAL
  Filled 2018-10-13: qty 2

## 2018-10-13 MED ORDER — FENTANYL CITRATE (PF) 250 MCG/5ML IJ SOLN
INTRAMUSCULAR | Status: AC
  Filled 2018-10-13: qty 5

## 2018-10-13 MED ORDER — LIDOCAINE 2% (20 MG/ML) 5 ML SYRINGE
INTRAMUSCULAR | Status: AC
  Filled 2018-10-13: qty 5

## 2018-10-13 MED ORDER — BUPIVACAINE LIPOSOME 1.3 % IJ SUSP
20.0000 mL | INTRAMUSCULAR | Status: DC
  Filled 2018-10-13: qty 20

## 2018-10-13 MED ORDER — CHLORHEXIDINE GLUCONATE 4 % EX LIQD: 60.0000 mL | Freq: Once | CUTANEOUS | Status: DC

## 2018-10-13 MED ORDER — ONDANSETRON HCL 4 MG/2ML IJ SOLN
INTRAMUSCULAR | Status: AC
  Filled 2018-10-13: qty 2

## 2018-10-13 MED ORDER — MIDAZOLAM HCL 2 MG/2ML IJ SOLN
INTRAMUSCULAR | Status: AC
  Filled 2018-10-13: qty 2

## 2018-10-13 MED ORDER — SODIUM CHLORIDE 0.9% FLUSH: 3.0000 mL | Freq: Two times a day (BID) | INTRAVENOUS | Status: DC

## 2018-10-13 MED ORDER — PROPOFOL 10 MG/ML IV BOLUS
INTRAVENOUS | Status: AC
  Filled 2018-10-13: qty 20

## 2018-10-13 MED ORDER — ACETAMINOPHEN 650 MG RE SUPP: 650.0000 mg | RECTAL | Status: DC | PRN

## 2018-10-13 MED ORDER — PROMETHAZINE HCL 25 MG/ML IJ SOLN: 6.2500 mg | INTRAMUSCULAR | Status: DC | PRN

## 2018-10-13 MED ORDER — 0.9 % SODIUM CHLORIDE (POUR BTL) OPTIME
TOPICAL | Status: DC | PRN
  Administered 2018-10-13: 1000 mL

## 2018-10-13 MED ORDER — CEFAZOLIN SODIUM-DEXTROSE 2-4 GM/100ML-% IV SOLN
2.0000 g | INTRAVENOUS | Status: AC
  Administered 2018-10-13: 2 g via INTRAVENOUS
  Filled 2018-10-13: qty 100

## 2018-10-13 MED ORDER — LACTATED RINGERS IV SOLN
INTRAVENOUS | Status: DC | PRN
  Administered 2018-10-13: 07:00:00 via INTRAVENOUS

## 2018-10-13 MED ORDER — SODIUM CHLORIDE 0.9 % IV SOLN: 250.0000 mL | INTRAVENOUS | Status: DC | PRN

## 2018-10-13 MED ORDER — PROPOFOL 1000 MG/100ML IV EMUL
INTRAVENOUS | Status: AC
  Filled 2018-10-13: qty 100

## 2018-10-13 MED ORDER — OXYCODONE HCL 5 MG PO TABS: 5.0000 mg | ORAL_TABLET | ORAL | Status: DC | PRN

## 2018-10-13 MED ORDER — DOCUSATE SODIUM 100 MG PO CAPS: 100.0000 mg | ORAL_CAPSULE | Freq: Two times a day (BID) | ORAL | 0 refills | Status: AC

## 2018-10-13 SURGICAL SUPPLY — 30 items
BLADE CLIPPER SURG (BLADE) IMPLANT
BNDG GAUZE ELAST 4 BULKY (GAUZE/BANDAGES/DRESSINGS) IMPLANT
CANISTER SUCT 3000ML PPV (MISCELLANEOUS) ×3 IMPLANT
COVER SURGICAL LIGHT HANDLE (MISCELLANEOUS) ×3 IMPLANT
COVER WAND RF STERILE (DRAPES) ×1 IMPLANT
DRAPE LAPAROSCOPIC ABDOMINAL (DRAPES) IMPLANT
DRAPE LAPAROTOMY 100X72 PEDS (DRAPES) ×2 IMPLANT
DRSG PAD ABDOMINAL 8X10 ST (GAUZE/BANDAGES/DRESSINGS) IMPLANT
ELECT REM PT RETURN 9FT ADLT (ELECTROSURGICAL) ×3
ELECTRODE REM PT RTRN 9FT ADLT (ELECTROSURGICAL) ×1 IMPLANT
GAUZE SPONGE 4X4 12PLY STRL (GAUZE/BANDAGES/DRESSINGS) IMPLANT
GLOVE BIO SURGEON STRL SZ 6 (GLOVE) ×3 IMPLANT
GLOVE INDICATOR 6.5 STRL GRN (GLOVE) ×3 IMPLANT
GOWN STRL REUS W/ TWL LRG LVL3 (GOWN DISPOSABLE) ×2 IMPLANT
GOWN STRL REUS W/TWL LRG LVL3 (GOWN DISPOSABLE) ×4
KIT BASIN OR (CUSTOM PROCEDURE TRAY) ×3 IMPLANT
KIT TURNOVER KIT B (KITS) ×3 IMPLANT
NS IRRIG 1000ML POUR BTL (IV SOLUTION) ×3 IMPLANT
PACK GENERAL/GYN (CUSTOM PROCEDURE TRAY) ×3 IMPLANT
PACK LITHOTOMY IV (CUSTOM PROCEDURE TRAY) IMPLANT
PAD ARMBOARD 7.5X6 YLW CONV (MISCELLANEOUS) ×3 IMPLANT
PENCIL SMOKE EVACUATOR (MISCELLANEOUS) ×3 IMPLANT
SUT ETHILON 3 0 FSL (SUTURE) ×2 IMPLANT
SUT VIC AB 3-0 SH 18 (SUTURE) ×2 IMPLANT
SWAB COLLECTION DEVICE MRSA (MISCELLANEOUS) IMPLANT
SWAB CULTURE ESWAB REG 1ML (MISCELLANEOUS) IMPLANT
TOWEL OR 17X24 6PK STRL BLUE (TOWEL DISPOSABLE) ×3 IMPLANT
TOWEL OR 17X26 10 PK STRL BLUE (TOWEL DISPOSABLE) ×3 IMPLANT
TUBE ANAEROBIC SPECIMEN COL (MISCELLANEOUS) IMPLANT
UNDERPAD 30X30 INCONTINENT (UNDERPADS AND DIAPERS) ×3 IMPLANT

## 2018-10-13 NOTE — Anesthesia Preprocedure Evaluation (Signed)
Anesthesia Evaluation  Patient identified by MRN, date of birth, ID band Patient awake    Reviewed: Allergy & Precautions, NPO status , Patient's Chart, lab work & pertinent test results  Airway Mallampati: II  TM Distance: >3 FB     Dental   Pulmonary neg pulmonary ROS,    breath sounds clear to auscultation       Cardiovascular negative cardio ROS   Rhythm:Regular Rate:Normal     Neuro/Psych negative neurological ROS     GI/Hepatic negative GI ROS, Neg liver ROS,   Endo/Other  negative endocrine ROS  Renal/GU negative Renal ROS     Musculoskeletal   Abdominal   Peds  Hematology negative hematology ROS (+)   Anesthesia Other Findings   Reproductive/Obstetrics                             Anesthesia Physical Anesthesia Plan  ASA: I  Anesthesia Plan: MAC   Post-op Pain Management:    Induction: Intravenous  PONV Risk Score and Plan: 2 and Propofol infusion, Ondansetron and Treatment may vary due to age or medical condition  Airway Management Planned: Natural Airway and Simple Face Mask  Additional Equipment:   Intra-op Plan:   Post-operative Plan:   Informed Consent: I have reviewed the patients History and Physical, chart, labs and discussed the procedure including the risks, benefits and alternatives for the proposed anesthesia with the patient or authorized representative who has indicated his/her understanding and acceptance.       Plan Discussed with: CRNA  Anesthesia Plan Comments:         Anesthesia Quick Evaluation

## 2018-10-13 NOTE — Anesthesia Procedure Notes (Signed)
Date/Time: 10/13/2018 7:30 AM Performed by: Laruth Bouchard., CRNA Pre-anesthesia Checklist: Patient identified, Emergency Drugs available, Suction available, Patient being monitored and Timeout performed Patient Re-evaluated:Patient Re-evaluated prior to induction Oxygen Delivery Method: Nasal cannula Preoxygenation: Pre-oxygenation with 100% oxygen Induction Type: IV induction Placement Confirmation: positive ETCO2

## 2018-10-13 NOTE — Discharge Instructions (Signed)
GENERAL SURGERY: POST OP INSTRUCTIONS  ######################################################################  EAT Gradually transition to a high fiber diet with a fiber supplement over the next few weeks after discharge.  Start with a pureed / full liquid diet (see below)  WALK Walk an hour a day.  Control your pain to do that.    CONTROL PAIN Control pain so that you can walk, sleep, tolerate sneezing/coughing, go up/down stairs.  HAVE A BOWEL MOVEMENT DAILY Keep your bowels regular to avoid problems.  OK to try a laxative to override constipation.  OK to use an antidairrheal to slow down diarrhea.  Call if not better after 2 tries  CALL IF YOU HAVE PROBLEMS/CONCERNS Call if you are still struggling despite following these instructions. Call if you have concerns not answered by these instructions  ######################################################################    1. DIET: Follow a light bland diet the first 24 hours after arrival home, such as soup, liquids, crackers, etc.  Be sure to include lots of fluids daily.  Avoid fast food or heavy meals as your are more likely to get nauseated.   2. Take your usually prescribed home medications unless otherwise directed. 3. PAIN CONTROL: a. Pain is best controlled by a usual combination of three different methods TOGETHER: i. Ice/Heat ii. Over the counter pain medication iii. Prescription pain medication b. Most patients will experience some swelling and bruising around the incisions.  Ice packs or heating pads (30-60 minutes up to 6 times a day) will help. Use ice for the first few days to help decrease swelling and bruising, then switch to heat to help relax tight/sore spots and speed recovery.  Some people prefer to use ice alone, heat alone, alternating between ice & heat.  Experiment to what works for you.  Swelling and bruising can take several weeks to resolve.   c. It is helpful to take an over-the-counter pain medication  regularly for the first few weeks.  Choose one of the following that works best for you: i. Naproxen (Aleve, etc)  Two 220mg  tabs twice a day ii. Ibuprofen (Advil, etc) Three 200mg  tabs four times a day (every meal & bedtime) iii. Acetaminophen (Tylenol, etc) 500-650mg  four times a day (every meal & bedtime) d. A  prescription for pain medication (such as oxycodone, hydrocodone, etc) should be given to you upon discharge.  Take your pain medication as prescribed.  i. If you are having problems/concerns with the prescription medicine (does not control pain, nausea, vomiting, rash, itching, etc), please call us [phone] to see if we need to switch you to a different pain medicine that will work better for you and/or control your side effect better. ii. If you need a refill on your pain medication, please contact your pharmacy.  They will contact our office to request authorization. Prescriptions will not be filled after 5 pm or on week-ends. 4. Avoid getting constipated.  Between the surgery and the pain medications, it is common to experience some constipation.  Increasing fluid intake and taking a fiber supplement (such as Metamucil, Citrucel, FiberCon, MiraLax, etc) 1-2 times a day regularly will usually help prevent this problem from occurring.  A mild laxative (prune juice, Milk of Magnesia, MiraLax, etc) should be taken according to package directions if there are no bowel movements after 48 hours.   5. Wash / shower every day, starting on post-op day 2.  Let the soap and water wash over the incision and pat dry. No rubbing, scrubbing, lotions or ointments to incision. No  soaking or swimming.  6. Remove your bandage 2days after surgery.  You may leave the incision open to air.  You may replace a dressing/Band-Aid to cover the incision to protect your clothing from any drainage or for comfort if you wish.   7. ACTIVITIES as tolerated:   a. You may resume regular (light) daily activities  beginning the next day--such as daily self-care, walking, climbing stairs--gradually increasing activities as tolerated.  If you can walk 30 minutes without difficulty, it is safe to try more intense activity such as jogging, treadmill, bicycling, low-impact aerobics, swimming, etc. b. Save the most intensive and strenuous activity for last such as sit-ups, heavy lifting, contact sports, etc  Refrain from any heavy lifting or straining until you are off narcotics for pain control.   c. DO NOT PUSH THROUGH PAIN.  Let pain be your guide: If it hurts to do something, don't do it.  Pain is your body warning you to avoid that activity for another week until the pain goes down. d. You may drive when you are no longer taking prescription pain medication, you can comfortably wear a seatbelt, and you can safely maneuver your car and apply brakes. e. Bonita Quin may have sexual intercourse when it is comfortable.  8. FOLLOW UP in our office a. Please call [facility] at [phone] to set up an appointment to see your surgeon in the office for a follow-up appointment approximately 2-3 weeks after your surgery. b. Make sure that you call for this appointment the day you arrive home to insure a convenient appointment time. 9. IF YOU HAVE DISABILITY OR FAMILY LEAVE FORMS, BRING THEM TO THE OFFICE FOR PROCESSING.  DO NOT GIVE THEM TO YOUR DOCTOR.   WHEN TO CALL us [phone]: 1. Poor pain control 2. Reactions / problems with new medications (rash/itching, nausea, etc)  3. Fever over 101.5 F (38.5 C) 4. Worsening swelling or bruising 5. Continued bleeding from incision. 6. Increased pain, redness, or drainage from the incision 7. Difficulty breathing / swallowing   The clinic staff is available to answer your questions during regular business hours (8:30am-5pm).  Please dont hesitate to call and ask to speak to one of our nurses for clinical concerns.   If you have a medical emergency, go to the nearest emergency room  or call 911.  A surgeon from [facility] is always on call at the [facility], [place] [address], [place], [place]  [address] ? MAIN: [phone] ? TOLL FREE: [phone] ?  FAX [phone] [email or web address]

## 2018-10-13 NOTE — Progress Notes (Signed)
Urine POCT was negative.

## 2018-10-13 NOTE — Transfer of Care (Signed)
Immediate Anesthesia Transfer of Care Note  Patient: Crystal Dominguez  Procedure(s) Performed: EXCISION OF RIGHT HIDRADENITIS AXILLARY (N/A Axilla)  Patient Location: PACU  Anesthesia Type:MAC  Level of Consciousness: drowsy  Airway & Oxygen Therapy: Patient Spontanous Breathing  Post-op Assessment: Report given to RN and Post -op Vital signs reviewed and stable  Post vital signs: Reviewed and stable  Last Vitals:  Vitals Value Taken Time  BP    Temp    Pulse    Resp    SpO2      Last Pain:  Vitals:   10/13/18 0601  TempSrc:   PainSc: 0-No pain      Patients Stated Pain Goal: 5 (02/16/29 0762)  Complications: No apparent anesthesia complications

## 2018-10-13 NOTE — Anesthesia Postprocedure Evaluation (Signed)
Anesthesia Post Note  Patient: Crystal Dominguez  Procedure(s) Performed: EXCISION OF RIGHT HIDRADENITIS AXILLARY (N/A Axilla)     Patient location during evaluation: PACU Anesthesia Type: MAC Level of consciousness: awake and alert Pain management: pain level controlled Vital Signs Assessment: post-procedure vital signs reviewed and stable Respiratory status: spontaneous breathing, nonlabored ventilation, respiratory function stable and patient connected to nasal cannula oxygen Cardiovascular status: stable and blood pressure returned to baseline Postop Assessment: no apparent nausea or vomiting Anesthetic complications: no    Last Vitals:  Vitals:   10/13/18 0900 10/13/18 0930  BP: 108/90 110/74  Pulse: 65 65  Resp: 14 16  Temp: 36.5 C   SpO2: 99% 99%    Last Pain:  Vitals:   10/13/18 0900  TempSrc:   PainSc: 0-No pain                 Tiajuana Amass

## 2018-10-13 NOTE — H&P (Signed)
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  This popped up in about October of last year.  She has been treated with innumerable courses of antibiotics.  Has not had any prior procedures and the lesion has been draining spontaneously, malodorous purulent fluid.  She never had any issues or lesions prior to the onset of this and denies any skin infections elsewhere.   Seen in urgent office 07/31/18: "The patient is a 33 year old female who presents with a complaint of Hidradenitis. She is presenting per request of [facility], who evaluated her on 07/10/18 for a draining area in her right axilla, which had been causing her issues since November 2019. She states a few month before she noticed any drainage, there was a lump in the area that would come and go. However, it became infected in October and she saw her OB/GYN. She was given antibiotics and referral to the Breast center. First [place] was on 03/24/18 and showed 2 large fluid collections. She was given Keflex. Repeat ultrasound 2 weeks later, showed decreased fluid collection and resolution of symptoms. She was seen by her PCP again and referred to a dermatologist who also gave her antibiotics.   She denies pain currently but states at one point, she was unable to lift her arm without pain. States the foul-smelling drainage began again about 4 days ago. Denies fevers. Denies much pain. She is frustrated because she feels like no one is giving her a solution to this issue, other than medications. She does not smoke. She is breast feeding"       Allergies  Allergen Reactions  . Shellfish-Derived Products     Crabmeat        Past Medical History:  Diagnosis Date  . Medical history non-contributory          Past Surgical History:  Procedure Laterality Date  .  DILATION AND CURETTAGE OF UTERUS    . THERAPEUTIC ABORTION           Family History  Problem Relation Age of Onset  . Epilepsy Father     Social History        Socioeconomic History  . Marital status: Single    Spouse name: Not on file  . Number of children: 0  . Years of education: Not on file  . Highest education level: Not on file  Occupational History  . Not on file  Social Needs  . Financial resource strain: Not on file  . Food insecurity:    Worry: Not on file    Inability: Not on file  . Transportation needs:    Medical: Not on file    Non-medical: Not on file  Tobacco Use  . Smoking status: Never Smoker  . Smokeless tobacco: Never Used  Substance and Sexual Activity  . Alcohol use: Not Currently    Alcohol/week: 3.0 standard drinks    Types: 3 Glasses of wine per week  . Drug use: No  . Sexual activity: Yes    Partners: Male    Birth control/protection: None  Lifestyle  . Physical activity:    Days per week: Not on file    Minutes per session: Not on file  . Stress: Not on file  Relationships  . Social connections:    Talks on phone: Not on file  Gets together: Not on file    Attends religious service: Not on file    Active member of club or organization: Not on file    Attends meetings of clubs or organizations: Not on file    Relationship status: Not on file  Other Topics Concern  . Not on file  Social History Narrative   She is from [place]. 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 She is currently in a relationship. She is sexually active with monogamous partner.     Current Outpatient Medications on File Prior to Visit  Medication Sig Dispense Refill  . fluticasone (FLONASE) 50 MCG/ACT nasal spray Place 1 spray into both nostrils 2 (two) times daily. (Patient not taking: Reported on 01/28/2018) 16 g 0  . ibuprofen (ADVIL,MOTRIN) 600 MG tablet Take 1 tablet (600 mg total) by  mouth every 6 (six) hours. 30 tablet 0  . Prenatal Vit-Fe Fumarate-FA (PRENATAL MULTIVITAMIN) TABS tablet Take 1 tablet by mouth daily at 12 noon.     No current facility-administered medications on file prior to visit.     Review of Systems: a complete, 10pt review of systems was completed with pertinent positives and negatives as documented in the HPI  Physical Exam: There were no vitals filed for this visit. Gen: alert and well appearing Eye: extraocular motion intact, no scleral icterus ENT: moist mucus membranes, dentition intact Neck: no mass or thyromegaly Chest: unlabored respirations, symmetrical air entry, clear bilaterally CV: regular rate and rhythm, no pedal edema Abdomen: soft, nontender, nondistended. No mass or organomegaly MSK: strength symmetrical throughout, no deformity Neuro: grossly intact, normal gait Psych: normal mood and affect, appropriate insight Skin: warm and dry.  In the right axilla there is a chronic subcutaneous sinus draining purulent fluid, parallel to the axillary crease and approximately 3 x 2 cm in dimensions   CBC Latest Ref Rng & Units 01/29/2018 01/28/2018 12/18/2016  WBC 4.0 - 10.5 K/uL 15.9(H) 14.3(H) CANCELED  Hemoglobin 12.0 - 15.0 g/dL 16.112.2 09.614.0 CANCELED  Hematocrit 36.0 - 46.0 % 35.7(L) 40.8 CANCELED  Platelets 150 - 400 K/uL 196 204 CANCELED    CMP Latest Ref Rng & Units 12/18/2016 12/18/2016  Glucose - CANCELED 92  BUN - CANCELED 9  Creatinine - CANCELED 0.61  Sodium - CANCELED 138  Potassium mmol/L CANCELED 4.3  Chloride - CANCELED 101  CO2 - CANCELED 24  Calcium mg/dL CANCELED 9.3  Total Protein g/dL CANCELED 6.7  Total Bilirubin - CANCELED 0.3  Alkaline Phos - CANCELED 49  AST - CANCELED 19  ALT - CANCELED 13    RecentLabs  No results found for: INR, PROTIME    Imaging: ImagingResults(Last48hours)  No results found.     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 It is a fairly localized lesion. Discussed local excision with risks of bleeding, infection, pain, scarring, wound dehiscence and need to care for indwelling for several weeks, discussed risk of recurrent hidradenitis in other locations in the axilla or other parts of the body. Questions were welcomed and answered to her satisfaction. We'll proceed with scheduling   Phylliss Blakeshelsea Tenishia Ekman, MD [facility] [place] Pager [phone]

## 2018-10-13 NOTE — Op Note (Signed)
Operative Note  Crystal Dominguez  [id number]  [id number]  10/13/2018   Surgeon: Vikki Ports A ConnorMD  Assistant: none  Procedure performed: excision of right axillary hidradenitis 7x2x1cm, skin and soft tissue extending into the pectoralis fascia in one isolated location   Preop diagnosis: hidradenitis/ chronic abscess Post-op diagnosis/intraop findings: same  Specimens: right axillary hidradenitis Retained items: no EBL: minimal cc Complications: none  Description of procedure: After obtaining informed consent the patient was taken to the operating room and placed supine on operating room table [facility] was initiated, preoperative antibiotics were administered, SCDs applied, and a formal timeout was performed.  The patient's right axilla and chest were prepped and draped in usual sterile fashion.  She has a localized area of chronic scarring and abscess in the midportion of the right axilla.  This was outlined with an elliptical form and after infiltration with quarter percent marcaine with epinephrine this was incised sharply.  The cautery was used for continued dissection down to healthy subcutaneous fat to remove all areas of chronic inflammation and abscess.  There was one area cephalad/anteriorly where there is a chronic abscess/sinus tract extending into the pectoralis fascia and this was completely excised.  Hemostasis was ensured with cautery.  The wound was then closed with interrupted deep dermal 3-0 Vicryls and vertical mattress 2-0 nylon's.  A dressing is applied and the patient was then awakened and taken to PACU in stable condition.   All counts were correct at the completion of the case.

## 2018-10-14 ENCOUNTER — Encounter (HOSPITAL_COMMUNITY): Payer: Self-pay | Admitting: Surgery

## 2018-11-23 ENCOUNTER — Telehealth: Payer: No Typology Code available for payment source | Admitting: Physician Assistant

## 2018-11-23 ENCOUNTER — Encounter: Payer: Self-pay | Admitting: Physician Assistant

## 2018-11-23 ENCOUNTER — Encounter (INDEPENDENT_AMBULATORY_CARE_PROVIDER_SITE_OTHER): Payer: Self-pay

## 2018-11-23 DIAGNOSIS — R059 Cough, unspecified: Secondary | ICD-10-CM

## 2018-11-23 DIAGNOSIS — Z20822 Contact with and (suspected) exposure to covid-19: Secondary | ICD-10-CM

## 2018-11-23 DIAGNOSIS — J029 Acute pharyngitis, unspecified: Secondary | ICD-10-CM

## 2018-11-23 DIAGNOSIS — R05 Cough: Secondary | ICD-10-CM

## 2018-11-23 NOTE — Progress Notes (Signed)
E-Visit for Corona Virus Screening   Your current symptoms could be consistent with the coronavirus.  Call your health care provider or local health department to request and arrange formal testing. Many health care providers can now test patients at their office but not all are.  Please quarantine yourself while awaiting your test results.  [place] [phone], [place], [place] [phone] or visit [email or web address]  and You have been enrolled in [place] for COVID-19.  Daily you will receive a questionnaire within the [place] website. Our COVID-19 response team will be monitoring your responses daily.    COVID-19 is a respiratory illness with symptoms that are similar to the flu. Symptoms are typically mild to moderate, but there have been cases of severe illness and death due to the virus. The following symptoms may appear 2-14 days after exposure: . Fever . Cough . Shortness of breath or difficulty breathing . Chills . Repeated shaking with chills . Muscle pain . Headache . Sore throat . New loss of taste or smell . Fatigue . Congestion or runny nose . Nausea or vomiting . Diarrhea  It is vitally important that if you feel that you have an infection such as this virus or any other virus that you stay home and away from places where you may spread it to others.  You should self-quarantine for 14 days if you have symptoms that could potentially be coronavirus or have been in close contact a with a person diagnosed with COVID-19 within the last 2 weeks. You should avoid contact with people age 62 and older.   You should wear a mask or cloth face covering over your nose and mouth if you must be around other people or animals, including pets (even at home). Try to stay at least 6 feet away from other people. This will protect the  people around you.    You may also take acetaminophen (Tylenol) as needed for fever.   Reduce your risk of any infection by using the same precautions used for avoiding the common cold or flu:  Marland Kitchen Wash your hands often with soap and warm water for at least 20 seconds.  If soap and water are not readily available, use an alcohol-based hand sanitizer with at least 60% alcohol.  . If coughing or sneezing, cover your mouth and nose by coughing or sneezing into the elbow areas of your shirt or coat, into a tissue or into your sleeve (not your hands). . Avoid shaking hands with others and consider head nods or verbal greetings only. . Avoid touching your eyes, nose, or mouth with unwashed hands.  . Avoid close contact with people who are sick. . Avoid places or events with large numbers of people in one location, like concerts or sporting events. . Carefully consider travel plans you have or are making. . If you are planning any travel outside or inside the [place], visit the CDC's Travelers' Health webpage for the latest health notices. . If you have some symptoms but not all symptoms, continue to monitor at home and seek medical attention if your symptoms worsen. . If you are having a medical emergency, call 911.  HOME CARE . Only take medications as instructed by your medical team. . Drink plenty of fluids and get plenty of rest. . A steam or ultrasonic humidifier can help if you have congestion.   GET HELP RIGHT AWAY IF YOU HAVE EMERGENCY WARNING SIGNS** FOR COVID-19.  If you or someone is showing any of these signs seek emergency medical care immediately. Call 911 or proceed to your closest emergency facility if: . You develop worsening high fever. . Trouble breathing . Bluish lips or face . Persistent pain or pressure in the chest . New confusion . Inability to wake or stay awake . You cough up blood. . Your symptoms become more severe  **This list is not all possible symptoms. Contact  your medical provider for any symptoms that are sever or concerning to you.   MAKE SURE YOU   Understand these instructions.  Will watch your condition.  Will get help right away if you are not doing well or get worse.  Your e-visit answers were reviewed by a board certified advanced clinical practitioner to complete your personal care plan.  Depending on the condition, your plan could have included both over the counter or prescription medications.  If there is a problem please reply once you have received a response from your provider.  Your safety is important to [place].  If you have drug allergies check your prescription carefully.    You can use MyChart to ask questions about today's visit, request a non-urgent call back, or ask for a work or school excuse for 24 hours related to this e-Visit. If it has been greater than 24 hours you will need to follow up with your provider, or enter a new e-Visit to address those concerns. You will get an e-mail in the next two days asking about your experience.  I hope that your e-visit has been valuable and will speed your recovery. Thank you for using e-visits. I spent 5-10 minutes on review and completion of this note- Lacy Duverney [facility]

## 2018-11-26 ENCOUNTER — Ambulatory Visit: Payer: Self-pay

## 2018-11-26 ENCOUNTER — Encounter (INDEPENDENT_AMBULATORY_CARE_PROVIDER_SITE_OTHER): Payer: Self-pay

## 2018-11-26 ENCOUNTER — Telehealth: Payer: Self-pay | Admitting: *Deleted

## 2018-11-26 NOTE — Telephone Encounter (Signed)
Covid-19 Incoming call from Pt.  Reporting Sx of Covid- 19.  Reports Sx started Monday.   Complains of temps 99.4 and 100. Worst Sx.  Was, is loss of taste.  Had a cough yesterday not today.  Temp. Of 99.4 Feels better today. Reports chills and headache and a little fatigue.  Reviewed protocol and provided care advice with Pt. Voiced understanding.       Reason for Disposition . [1] COVID-19 infection suspected by caller or triager AND [2] mild symptoms (cough, fever, or others) AND [1] no complications or SOB  Answer Assessment - Initial Assessment Questions 1. COVID-19 DIAGNOSIS: "Who made your Coronavirus (COVID-19) diagnosis?" "Was it confirmed by a positive lab test?" If not diagnosed by a HCP, ask "Are there lots of cases (community spread) where you live?" (See public health department website, if unsure)     *No Answer* 2. ONSET: "When did the COVID-19 symptoms start?"      Monday 3. WORST SYMPTOM: "What is your worst symptom?" (e.g., cough, fever, shortness of breath, muscle aches)     Fever 100.4  mucsle aches, loss taste and smell 4. COUGH: "Do you have a cough?" If so, ask: "How bad is the cough?"       Yesterday not today 5. FEVER: "Do you have a fever?" If so, ask: "What is your temperature, how was it measured, and when did it start?"    99.4 6. RESPIRATORY STATUS: "Describe your breathing?" (e.g., shortness of breath, wheezing, unable to speak)      denies 7. BETTER-SAME-WORSE: "Are you getting better, staying the same or getting worse compared to yesterday?"  If getting worse, ask, "In what way?"     better 8. HIGH RISK DISEASE: "Do you have any chronic medical problems?" (e.g., asthma, heart or lung disease, weak immune system, etc.)     Denies 9. PREGNANCY: "Is there any chance you are pregnant?" "When was your last menstrual period?"     Breast feeding 10. OTHER SYMPTOMS: "Do you have any other symptoms?"  (e.g., chills, fatigue, headache, loss of smell or taste, muscle  pain, sore throat)      Chills headache a little fatigue  Protocols used: CORONAVIRUS (COVID-19) DIAGNOSED OR SUSPECTED-A-AH

## 2018-11-26 NOTE — Telephone Encounter (Signed)
Contacted pt regarding my chart companion response of cough which is new; she says that she now has lost her sense of taste; she is also concerned because her daughter goes to day care and she does not want to infect other children; the pt says that she has not been tested, and her daughter is not having any symptoms; advised her to notify her daughter's pediatrician; pt advised to contact her PCP for further recommendation; she has not taken anything; recommendations given per nurse triage protocol: COUGH MEDICINES:  * OTC COUGH SYRUPS: The most common cough suppressant in OTC cough medications is dextromethorphan. Often the letters 'DM' appear in the name.  * OTC COUGH DROPS: Cough drops can help a lot, especially for mild coughs. They reduce coughing by soothing your irritated throat and removing that tickle sensation in the back of the throat. Cough drops also have the advantage of portability - you can carry them with you.  * HOME REMEDY - HARD CANDY: Hard candy works just as well as medicine-flavored OTC cough drops. People who have diabetes should use sugar-free candy.  * HOME REMEDY - HONEY: This old home remedy has been shown to help decrease coughing at night. The adult dosage is 2 teaspoons (10 ml) at bedtime. Honey should not be given to infants under one year of age.    8: CAUTION - DEXTROMETHORPHAN:  * Do not try to completely suppress coughs that produce mucus and phlegm. Remember that coughing is helpful in bringing up mucus from the lungs and preventing pneumonia.    She verbalizes understanding.

## 2018-12-04 ENCOUNTER — Encounter (INDEPENDENT_AMBULATORY_CARE_PROVIDER_SITE_OTHER): Payer: Self-pay

## 2020-03-18 IMAGING — MG DIGITAL DIAGNOSTIC BILATERAL MAMMOGRAM WITH TOMO AND CAD
6 of 10 series · 6 of 30 positions shown · non-contrast
Comparison: Baseline mammogram.

CLINICAL DATA: Right axillary swelling noted by the patient several
weeks ago. The patient has been breast feeding for the last 7 weeks.

EXAM:
DIGITAL DIAGNOSTIC BILATERAL MAMMOGRAM WITH CAD AND TOMO
ULTRASOUND RIGHT BREAST

[R MLO synth-2D]
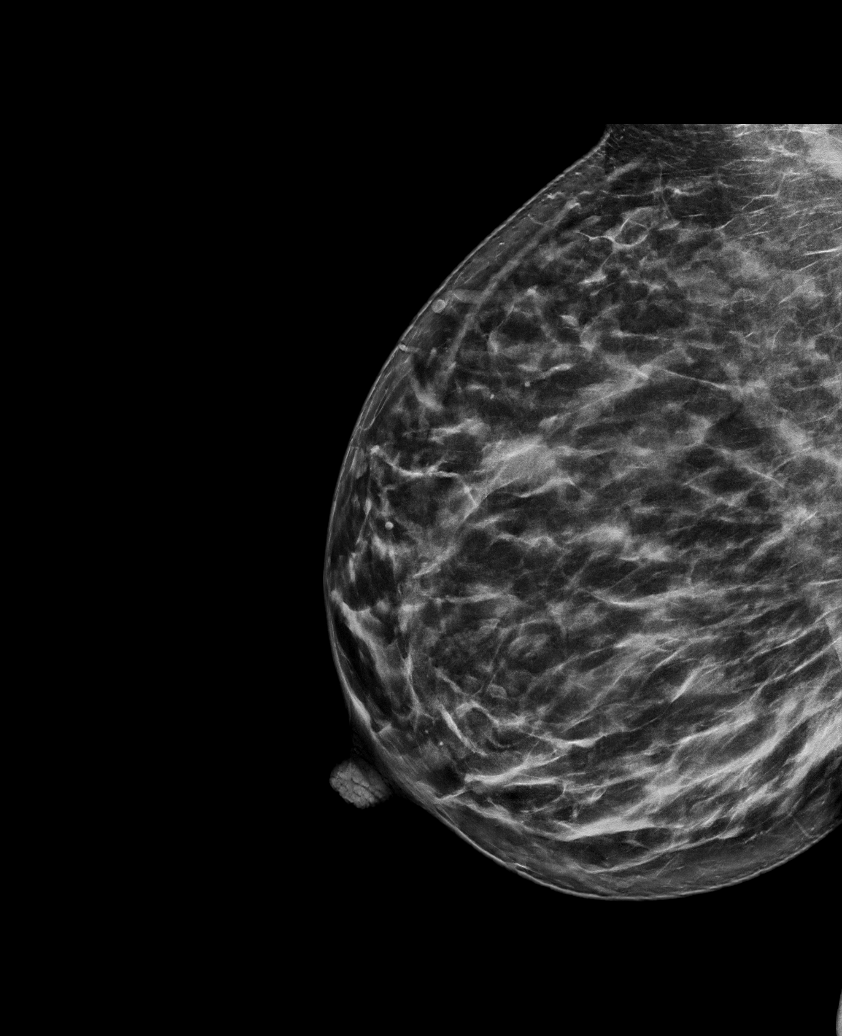

[L CC synth-2D]
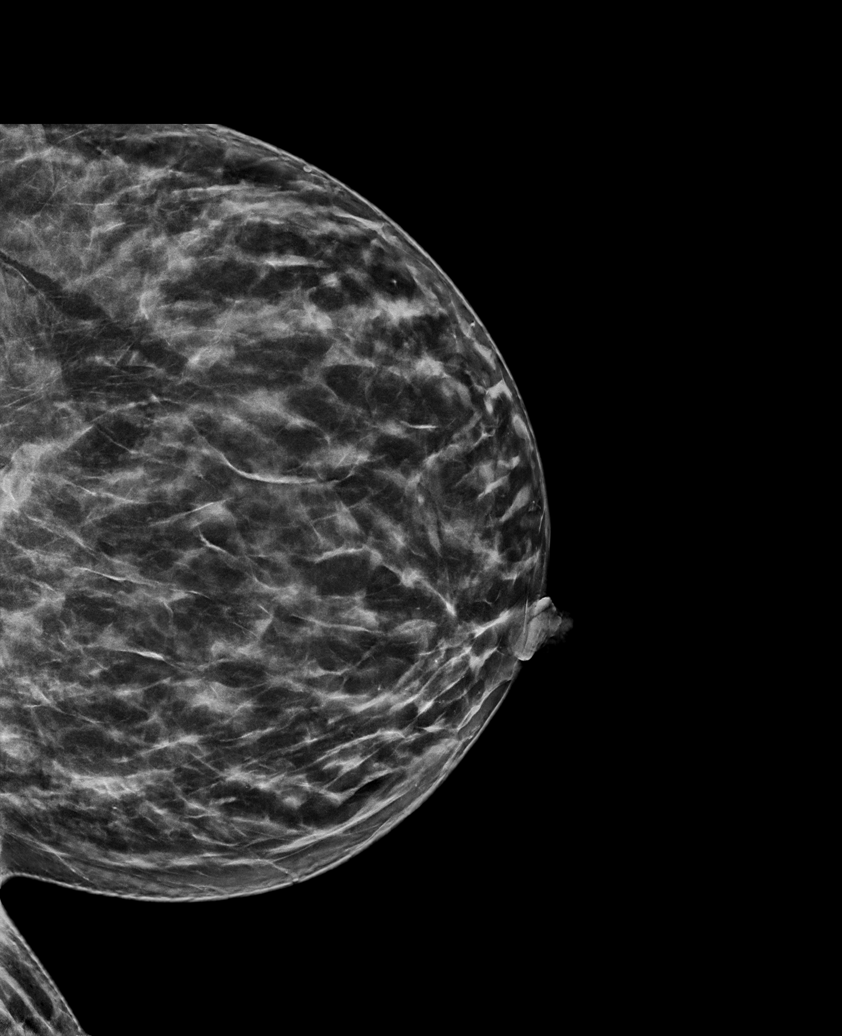

[R TAN synth-2D]
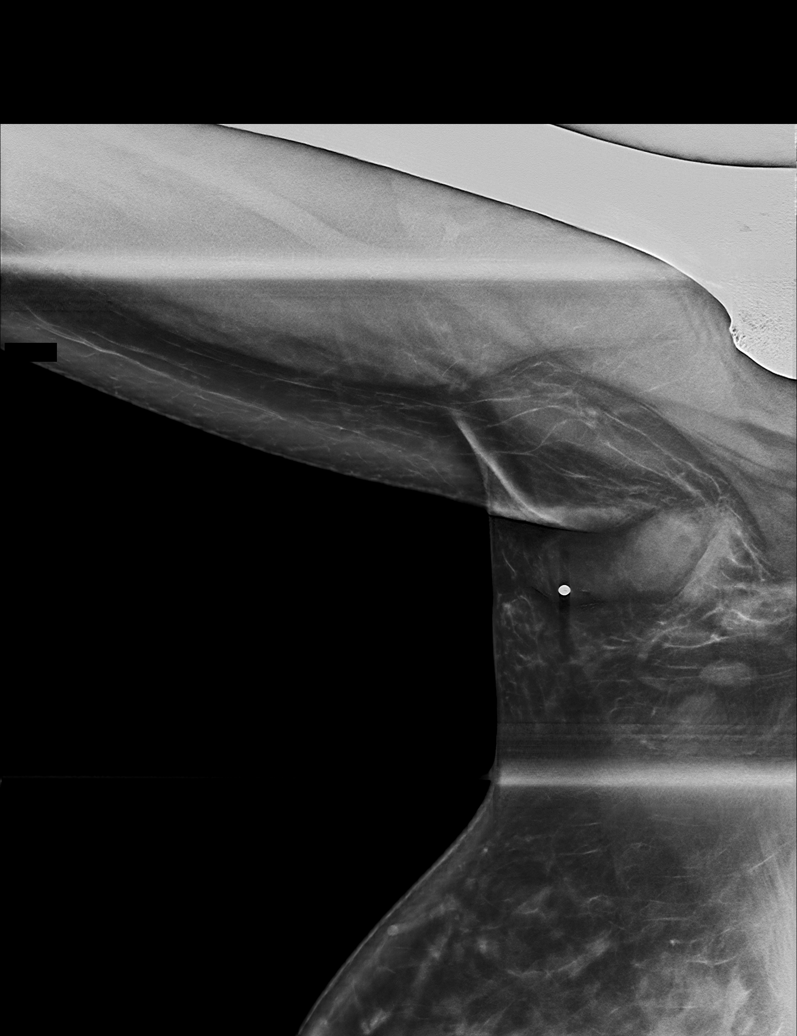

[L MLO synth-2D]
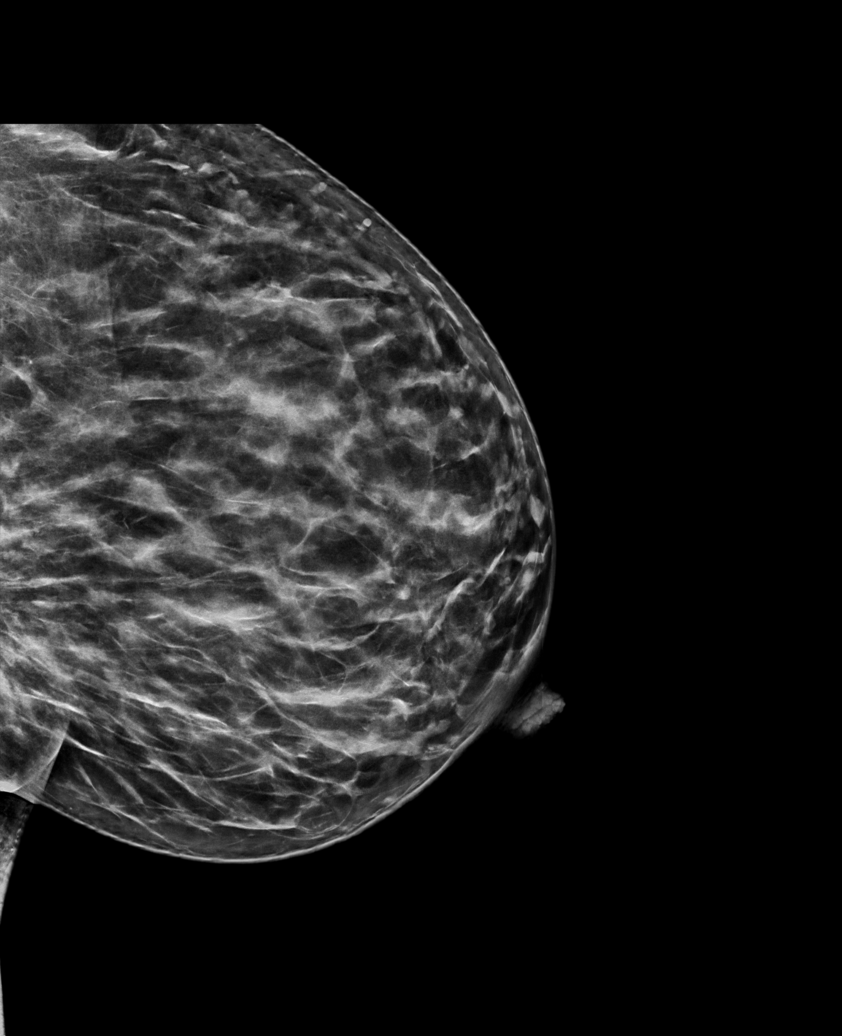

[R CC synth-2D]
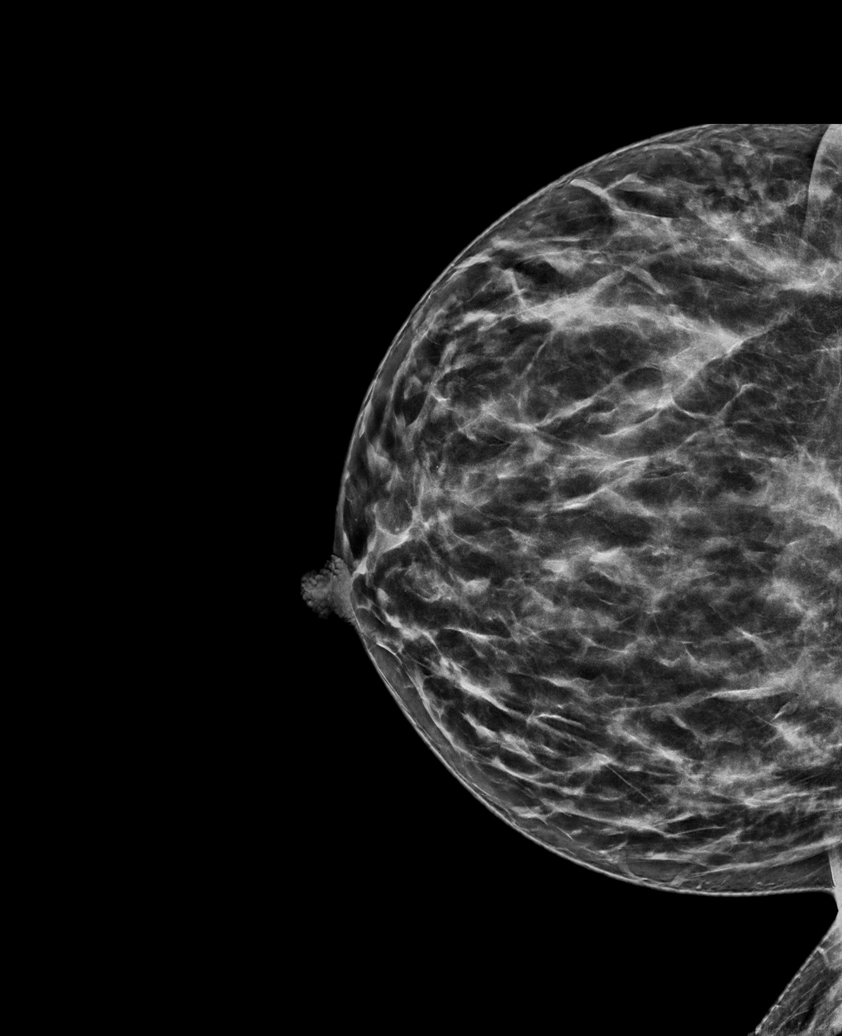

[R TAN tomo · tomo slice 40/79.0]
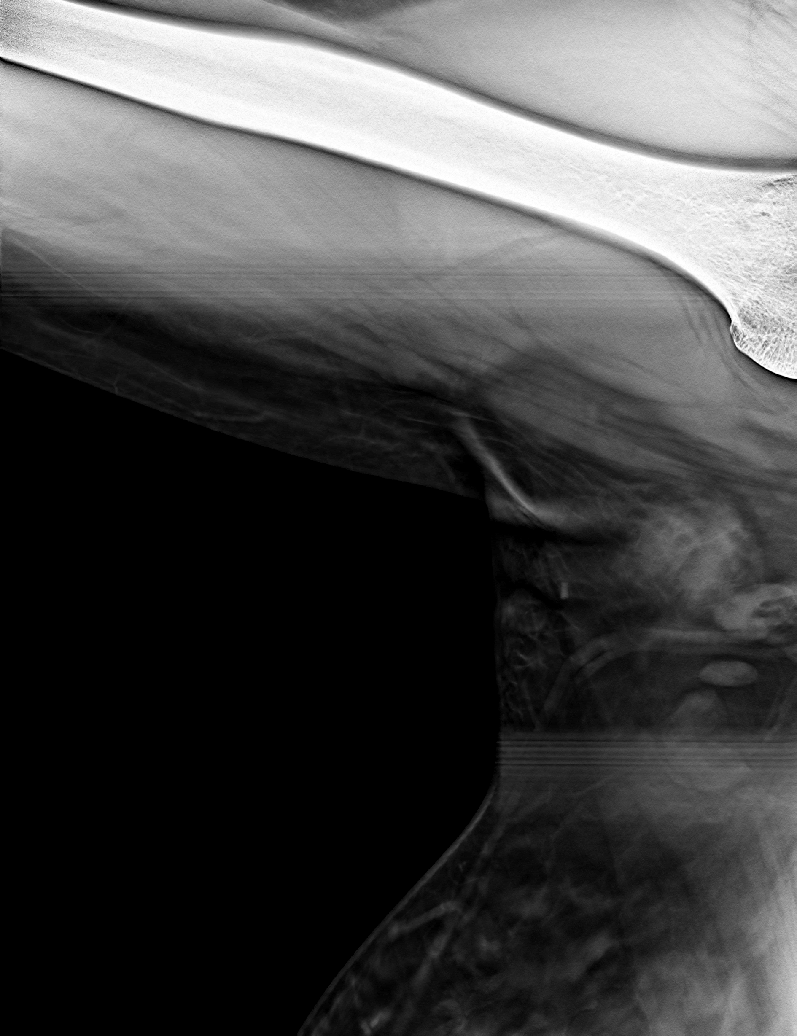

[6 of 30 positions shown; findings below may reference images not displayed]

ACR Breast Density Category c: The breast tissue is heterogeneously
dense, which may obscure small masses.
FINDINGS: Mammographically, there are no suspicious masses, areas of
architectural distortion or microcalcifications in either breast.
Circumscribed hyperdense superficially located microlobulated mass
is seen in the right axilla, which correspond to the area of
palpable concern.

Mammographic images were processed with CAD.

On physical exam, there is a moderately firm palpable approximately
5 cm area in the right axilla.

Targeted ultrasound is performed, showing complicated superficially
located fluid collections in the right axilla, which correspond to
the area of palpable concern. Two such collections are seen,
measuring approximately 4.7 x 1.3 x 2.3 cm and 5.1 x 1.1 x 2.1. A
skin tract could be seen associated with the small collection.
Mobile debris is noted within the collections. A lymph node in the
right axilla with a 4 mm symmetrically thickened cortex is felt to
represent a reactive lymph node.
IMPRESSION: No mammographic evidence of malignancy in either breast.

Loculated complicated fluid collections in the right axilla may
represent a superficial subcutaneous abscess or galactocele within
accessory breast tissue.

RECOMMENDATION:
The patient was given a prescription for oral Keflex for 10 days,
and asked to return in 2 weeks for re-evaluation with a focused
right axillary ultrasound. She was encouraged to return sooner
should she notice any worsening.

I have discussed the findings and recommendations with the patient.
Results were also provided in writing at the conclusion of the
visit. If applicable, a reminder letter will be sent to the patient
regarding the next appointment.

BI-RADS CATEGORY  3: Probably benign.

## 2021-01-06 ENCOUNTER — Ambulatory Visit (HOSPITAL_COMMUNITY)
Admission: EM | Admit: 2021-01-06 | Discharge: 2021-01-06 | Disposition: A | Discharge status: Home / Self Care | Payer: No Typology Code available for payment source

## 2021-01-06 ENCOUNTER — Other Ambulatory Visit: Payer: Self-pay

## 2021-01-06 DIAGNOSIS — F909 Attention-deficit hyperactivity disorder, unspecified type: Secondary | ICD-10-CM | POA: Diagnosis not present

## 2021-01-06 NOTE — Progress Notes (Signed)
   01/06/21 1210  [facility] Triage Screening (Walk-ins at [facility] only)  How Did You Hear About [place]? Other (Comment)  What Is the Reason for Your Visit/Call Today? Patient referred by Dr. Gloris [place] office to get Adderall refilled.  Patient understands this may not be an option today.  She denies SI, HI and AVH.  No other concerns.  Patient to be seen by a provider re: med refill request.  How Long Has This Been Causing You Problems? <Week  Have You Recently Had Any Thoughts About Hurting Yourself? No  Are You Planning to Commit Suicide/Harm Yourself At This time? No  Have you Recently Had Thoughts About Hurting Someone Karolee Ohs? No  Are You Planning To Harm Someone At This Time? No  Are you currently experiencing any auditory, visual or other hallucinations? No  Have You Used Any Alcohol or Drugs in the Past 24 Hours? No  Do you have any current medical co-morbidities that require immediate attention? No  Clinician description of patient physical appearance/behavior: Calm, cooperative, pleasant.  What Do You Feel Would Help You the Most Today? Medication(s)  If access to [facility] was not available, would you have sought care in the Emergency Department? No  Determination of Need Routine (7 days)  Options For Referral Medication Management

## 2021-01-06 NOTE — ED Provider Notes (Signed)
[facility] Medical Screening Exam  Patient Name: Crystal Dominguez MRN: [id number] Date of Evaluation: 01/06/21 Chief Complaint:   Diagnosis:  Final diagnoses:  Attention deficit hyperactivity disorder (ADHD), unspecified ADHD type    History of Present illness: Crystal Dominguez is a 35 y.o. female patient presented to [facility] as a walk in alone with complaints of "I need a refill of my Adderall".  Crystal Dominguez, 35 y.o., female patient seen face to face by this provider, consulted with Dr. Bronwen Betters; and chart reviewed on 01/06/21.  On evaluation Crystal Dominguez reports she has been trying to get in touch with her doctors office, [facility] Dr. Jannifer Franklin to have her Adderall refilled.  States her phone calls have not been returned by her provider.  Reports the receptionist instructed her to come to the [address] and she would be able to have her Adderall refilled by a provider here.  Educated patient that controlled medications are not prescribed at this facility, instructed her to follow-up with Dr. Mervyn Skeeters.  Patient expressed her frustration with not obtaining refills, but stated she understood.  During evaluation Crystal Dominguez is in sitting position in no acute distress.  She is well-groomed, makes good eye contact.  Her speech is clear, coherent, normal rate and tone.  She is alert, oriented x 4, calm and cooperative.  Her mood is euthymic with congruent affect.  She does not appear to be responding to internal/external stimuli or delusional thoughts.  Patient denies suicidal/self-harm/homicidal ideation, psychosis, and paranoia.  Patient answered question appropriately.    Patient states that she has no other concerns at this time. Denies any health concerns that needs to be addressed.   Psychiatric Specialty Exam  Presentation  General Appearance:Appropriate for Environment; Well Groomed  Eye Contact:Good  Speech:Clear and Coherent; Normal Rate  Speech  Volume:Normal  Handedness:Right   Mood and Affect  Mood:Euthymic  Affect:Appropriate; Congruent   Thought Process  Thought Processes:Coherent  Descriptions of Associations:Intact  Orientation:Full (Time, Place and Person)  Thought Content:Logical    Hallucinations:None  Ideas of Reference:None  Suicidal Thoughts:No  Homicidal Thoughts:No   Sensorium  Memory:Immediate Good; Recent Good; Remote Good  Judgment:Good  Insight:Good   Executive Functions  Concentration:Good  Attention Span:Good  Recall:Good  Fund of Knowledge: No data recorded Language:Good   Psychomotor Activity  Psychomotor Activity:Normal   Assets  Assets:Communication Skills; Desire for Improvement; Financial Resources/Insurance; Housing; Physical Health; Resilience; Social Support   Sleep  Sleep:Good  Number of hours:  No data recorded  No data recorded  Physical Exam: Physical Exam Vitals and nursing note reviewed.  Constitutional:      General: She is not in acute distress.    Appearance: Normal appearance. She is not ill-appearing.  HENT:     Head: Normocephalic.  Eyes:     General:        Right eye: No discharge.        Left eye: No discharge.     Conjunctiva/sclera: Conjunctivae normal.  Cardiovascular:     Rate and Rhythm: Normal rate.  Pulmonary:     Effort: Pulmonary effort is normal. No respiratory distress.  Musculoskeletal:        General: Normal range of motion.     Cervical back: Normal range of motion.  Skin:    General: Skin is warm.     Coloration: Skin is not jaundiced or pale.  Neurological:     Mental Status: She is alert and oriented to person, place, and  time.  Psychiatric:        Attention and Perception: Attention and perception normal.        Mood and Affect: Mood and affect normal.        Speech: Speech normal.        Behavior: Behavior normal. Behavior is cooperative.        Thought Content: Thought content normal.        Cognition  and Memory: Cognition normal.        Judgment: Judgment normal.   Review of Systems  Constitutional: Negative.  Negative for chills and fever.  HENT: Negative.  Negative for hearing loss.   Respiratory: Negative.  Negative for cough.   Cardiovascular: Negative.  Negative for chest pain.  Musculoskeletal: Negative.   Skin: Negative.   Neurological: Negative.   Psychiatric/Behavioral: Negative.    Blood pressure 129/75, pulse 100, temperature 98.7 F (37.1 C), resp. rate 16, SpO2 100 %, currently breastfeeding. There is no height or weight on file to calculate BMI.  Musculoskeletal: Strength & Muscle Tone: within normal limits Gait & Station: normal Patient leans: N/A   [facility] MSE Discharge Disposition for Follow up and Recommendations: Based on my evaluation the patient does not appear to have an emergency medical condition and can be discharged with resources and follow up care in outpatient services for Medication Management  Patient agrees to follow-up with Dr. Mervyn Skeeters, her outpatient psychiatric provider for Adderall refills.  No evidence of imminent risk to self or others at present.    Patient does not meet criteria for psychiatric inpatient admission. Discussed crisis plan, support from social network, calling 911, coming to the Emergency Department, and calling Suicide Hotline.   Ardis Hughs, NP 01/06/2021, 12:55 PM

## 2021-01-06 NOTE — ED Notes (Signed)
Pt discharged in no acute distress. Safety maintained. 

## 2021-01-06 NOTE — Discharge Instructions (Signed)
Patient is instructed prior to discharge to: Take all medications as prescribed by his/her mental healthcare provider. Report any adverse effects and or reactions from the medicines to his/her outpatient provider promptly. Patient has been instructed & cautioned: To not engage in alcohol and or illegal drug use while on prescription medicines. In the event of worsening symptoms, patient is instructed to call the crisis hotline, 911 and or go to the nearest ED for appropriate evaluation and treatment of symptoms. To follow-up with his/her primary care provider for your other medical issues, concerns and or health care needs.  Please follow up with Dr. Mervyn Skeeters at [facility] for medication management and refills

## 2022-10-15 ENCOUNTER — Other Ambulatory Visit: Payer: Self-pay | Admitting: Rheumatology

## 2022-10-15 ENCOUNTER — Encounter: Payer: Self-pay | Admitting: Rheumatology

## 2022-10-15 DIAGNOSIS — R590 Localized enlarged lymph nodes: Secondary | ICD-10-CM

## 2022-11-15 ENCOUNTER — Ambulatory Visit
Admission: RE | Admit: 2022-11-15 | Discharge: 2022-11-15 | Disposition: A | Discharge status: Home / Self Care | Payer: No Typology Code available for payment source | Source: Ambulatory Visit | Attending: Rheumatology | Admitting: Rheumatology

## 2022-11-15 DIAGNOSIS — R590 Localized enlarged lymph nodes: Secondary | ICD-10-CM
